# Patient Record
Sex: Female | Born: 1955
Health system: Southern US, Community
[De-identification: ages and names within clinical notes are randomized; demographics above are authoritative.]

## PROBLEM LIST (undated history)

## (undated) ENCOUNTER — Ambulatory Visit (HOSPITAL_COMMUNITY): Admission: EM | Payer: Medicare HMO | Source: Home / Self Care

## (undated) DIAGNOSIS — I10 Essential (primary) hypertension: Secondary | ICD-10-CM

## (undated) DIAGNOSIS — K219 Gastro-esophageal reflux disease without esophagitis: Secondary | ICD-10-CM

## (undated) DIAGNOSIS — E785 Hyperlipidemia, unspecified: Secondary | ICD-10-CM

## (undated) DIAGNOSIS — R32 Unspecified urinary incontinence: Secondary | ICD-10-CM

## (undated) DIAGNOSIS — M545 Low back pain, unspecified: Secondary | ICD-10-CM

## (undated) DIAGNOSIS — M7580 Other shoulder lesions, unspecified shoulder: Secondary | ICD-10-CM

## (undated) DIAGNOSIS — M1990 Unspecified osteoarthritis, unspecified site: Secondary | ICD-10-CM

## (undated) DIAGNOSIS — G8929 Other chronic pain: Secondary | ICD-10-CM

## (undated) HISTORY — DX: Low back pain: M54.5

## (undated) HISTORY — DX: Unspecified urinary incontinence: R32

## (undated) HISTORY — PX: HAMMER TOE SURGERY: SHX385

## (undated) HISTORY — PX: WISDOM TOOTH EXTRACTION: SHX21

## (undated) HISTORY — DX: Low back pain, unspecified: M54.50

## (undated) HISTORY — DX: Other shoulder lesions, unspecified shoulder: M75.80

## (undated) HISTORY — DX: Other chronic pain: G89.29

## (undated) HISTORY — DX: Unspecified osteoarthritis, unspecified site: M19.90

## (undated) HISTORY — PX: HERNIA REPAIR: SHX51

## (undated) HISTORY — PX: CARPAL TUNNEL RELEASE: SHX101

## (undated) HISTORY — PX: SHOULDER SURGERY: SHX246

---

## 1997-07-02 ENCOUNTER — Ambulatory Visit (HOSPITAL_COMMUNITY): Admission: RE | Admit: 1997-07-02 | Discharge: 1997-07-02 | Payer: Self-pay

## 1997-11-03 ENCOUNTER — Ambulatory Visit (HOSPITAL_COMMUNITY): Admission: RE | Admit: 1997-11-03 | Discharge: 1997-11-03 | Payer: Self-pay | Admitting: Obstetrics & Gynecology

## 1998-07-08 ENCOUNTER — Encounter: Payer: Self-pay | Admitting: Obstetrics & Gynecology

## 1998-07-08 ENCOUNTER — Ambulatory Visit (HOSPITAL_COMMUNITY): Admission: RE | Admit: 1998-07-08 | Discharge: 1998-07-08 | Payer: Self-pay | Admitting: Obstetrics & Gynecology

## 1999-07-27 ENCOUNTER — Encounter: Payer: Self-pay | Admitting: Obstetrics and Gynecology

## 1999-07-27 ENCOUNTER — Ambulatory Visit (HOSPITAL_COMMUNITY): Admission: RE | Admit: 1999-07-27 | Discharge: 1999-07-27 | Payer: Self-pay | Admitting: Obstetrics and Gynecology

## 1999-08-10 ENCOUNTER — Other Ambulatory Visit: Admission: RE | Admit: 1999-08-10 | Discharge: 1999-08-10 | Payer: Self-pay | Admitting: Obstetrics and Gynecology

## 1999-08-19 ENCOUNTER — Ambulatory Visit (HOSPITAL_COMMUNITY): Admission: RE | Admit: 1999-08-19 | Discharge: 1999-08-19 | Payer: Self-pay | Admitting: Obstetrics and Gynecology

## 1999-08-19 ENCOUNTER — Encounter: Payer: Self-pay | Admitting: Obstetrics and Gynecology

## 1999-09-07 ENCOUNTER — Encounter (INDEPENDENT_AMBULATORY_CARE_PROVIDER_SITE_OTHER): Payer: Self-pay

## 1999-09-07 ENCOUNTER — Other Ambulatory Visit: Admission: RE | Admit: 1999-09-07 | Discharge: 1999-09-07 | Payer: Self-pay | Admitting: Obstetrics and Gynecology

## 2000-01-31 ENCOUNTER — Other Ambulatory Visit: Admission: RE | Admit: 2000-01-31 | Discharge: 2000-01-31 | Payer: Self-pay | Admitting: Obstetrics and Gynecology

## 2000-03-07 ENCOUNTER — Encounter (INDEPENDENT_AMBULATORY_CARE_PROVIDER_SITE_OTHER): Payer: Self-pay | Admitting: *Deleted

## 2000-03-07 ENCOUNTER — Ambulatory Visit (HOSPITAL_COMMUNITY): Admission: RE | Admit: 2000-03-07 | Discharge: 2000-03-07 | Payer: Self-pay | Admitting: Gastroenterology

## 2000-05-30 ENCOUNTER — Other Ambulatory Visit: Admission: RE | Admit: 2000-05-30 | Discharge: 2000-05-30 | Payer: Self-pay | Admitting: Obstetrics and Gynecology

## 2000-05-30 ENCOUNTER — Encounter (INDEPENDENT_AMBULATORY_CARE_PROVIDER_SITE_OTHER): Payer: Self-pay

## 2000-08-02 ENCOUNTER — Ambulatory Visit (HOSPITAL_COMMUNITY): Admission: RE | Admit: 2000-08-02 | Discharge: 2000-08-02 | Payer: Self-pay | Admitting: Obstetrics and Gynecology

## 2000-08-02 ENCOUNTER — Encounter: Payer: Self-pay | Admitting: Obstetrics and Gynecology

## 2000-08-23 ENCOUNTER — Other Ambulatory Visit: Admission: RE | Admit: 2000-08-23 | Discharge: 2000-08-23 | Payer: Self-pay | Admitting: Obstetrics and Gynecology

## 2000-12-09 ENCOUNTER — Emergency Department (HOSPITAL_COMMUNITY): Admission: EM | Admit: 2000-12-09 | Discharge: 2000-12-09 | Payer: Self-pay

## 2001-03-20 HISTORY — PX: ABDOMINAL HYSTERECTOMY: SHX81

## 2001-05-08 ENCOUNTER — Ambulatory Visit (HOSPITAL_COMMUNITY): Admission: RE | Admit: 2001-05-08 | Discharge: 2001-05-08 | Payer: Self-pay | Admitting: Gastroenterology

## 2001-08-06 ENCOUNTER — Ambulatory Visit (HOSPITAL_COMMUNITY): Admission: RE | Admit: 2001-08-06 | Discharge: 2001-08-06 | Payer: Self-pay | Admitting: Obstetrics and Gynecology

## 2001-08-06 ENCOUNTER — Encounter: Payer: Self-pay | Admitting: Obstetrics and Gynecology

## 2001-09-05 ENCOUNTER — Other Ambulatory Visit: Admission: RE | Admit: 2001-09-05 | Discharge: 2001-09-05 | Payer: Self-pay | Admitting: Obstetrics and Gynecology

## 2001-09-12 ENCOUNTER — Encounter: Admission: RE | Admit: 2001-09-12 | Discharge: 2001-09-12 | Payer: Self-pay | Admitting: Urology

## 2001-09-12 ENCOUNTER — Encounter: Payer: Self-pay | Admitting: Urology

## 2001-10-08 ENCOUNTER — Encounter (INDEPENDENT_AMBULATORY_CARE_PROVIDER_SITE_OTHER): Payer: Self-pay | Admitting: Specialist

## 2001-10-08 ENCOUNTER — Inpatient Hospital Stay (HOSPITAL_COMMUNITY): Admission: RE | Admit: 2001-10-08 | Discharge: 2001-10-11 | Payer: Self-pay | Admitting: Obstetrics and Gynecology

## 2002-07-01 ENCOUNTER — Ambulatory Visit (HOSPITAL_BASED_OUTPATIENT_CLINIC_OR_DEPARTMENT_OTHER): Admission: RE | Admit: 2002-07-01 | Discharge: 2002-07-01 | Payer: Self-pay | Admitting: Urology

## 2002-07-01 ENCOUNTER — Encounter (INDEPENDENT_AMBULATORY_CARE_PROVIDER_SITE_OTHER): Payer: Self-pay | Admitting: Specialist

## 2002-08-13 ENCOUNTER — Ambulatory Visit (HOSPITAL_COMMUNITY): Admission: RE | Admit: 2002-08-13 | Discharge: 2002-08-13 | Payer: Self-pay | Admitting: Obstetrics and Gynecology

## 2002-08-13 ENCOUNTER — Encounter: Payer: Self-pay | Admitting: Obstetrics and Gynecology

## 2003-08-27 ENCOUNTER — Ambulatory Visit (HOSPITAL_COMMUNITY): Admission: RE | Admit: 2003-08-27 | Discharge: 2003-08-27 | Payer: Self-pay | Admitting: Obstetrics and Gynecology

## 2004-09-22 ENCOUNTER — Ambulatory Visit (HOSPITAL_COMMUNITY): Admission: RE | Admit: 2004-09-22 | Discharge: 2004-09-22 | Payer: Self-pay | Admitting: Orthopedic Surgery

## 2004-09-22 ENCOUNTER — Ambulatory Visit (HOSPITAL_BASED_OUTPATIENT_CLINIC_OR_DEPARTMENT_OTHER): Admission: RE | Admit: 2004-09-22 | Discharge: 2004-09-22 | Payer: Self-pay | Admitting: Orthopedic Surgery

## 2004-10-28 ENCOUNTER — Ambulatory Visit (HOSPITAL_COMMUNITY): Admission: RE | Admit: 2004-10-28 | Discharge: 2004-10-28 | Payer: Self-pay | Admitting: Obstetrics and Gynecology

## 2005-12-04 ENCOUNTER — Ambulatory Visit (HOSPITAL_COMMUNITY): Admission: RE | Admit: 2005-12-04 | Discharge: 2005-12-04 | Payer: Self-pay | Admitting: Obstetrics and Gynecology

## 2006-06-07 ENCOUNTER — Inpatient Hospital Stay (HOSPITAL_COMMUNITY): Admission: EM | Admit: 2006-06-07 | Discharge: 2006-06-09 | Payer: Self-pay | Admitting: Emergency Medicine

## 2006-06-07 ENCOUNTER — Ambulatory Visit: Payer: Self-pay | Admitting: Internal Medicine

## 2006-06-08 ENCOUNTER — Encounter: Payer: Self-pay | Admitting: Internal Medicine

## 2006-06-14 ENCOUNTER — Ambulatory Visit: Payer: Self-pay

## 2006-06-21 ENCOUNTER — Ambulatory Visit: Payer: Self-pay | Admitting: Cardiology

## 2006-07-27 ENCOUNTER — Encounter: Admission: RE | Admit: 2006-07-27 | Discharge: 2006-07-27 | Payer: Self-pay | Admitting: Family Medicine

## 2006-09-12 ENCOUNTER — Ambulatory Visit (HOSPITAL_BASED_OUTPATIENT_CLINIC_OR_DEPARTMENT_OTHER): Admission: RE | Admit: 2006-09-12 | Discharge: 2006-09-12 | Payer: Self-pay | Admitting: Orthopedic Surgery

## 2007-01-08 ENCOUNTER — Ambulatory Visit (HOSPITAL_COMMUNITY): Admission: RE | Admit: 2007-01-08 | Discharge: 2007-01-08 | Payer: Self-pay | Admitting: Obstetrics and Gynecology

## 2007-11-26 ENCOUNTER — Encounter: Payer: Self-pay | Admitting: Internal Medicine

## 2007-12-23 ENCOUNTER — Encounter: Payer: Self-pay | Admitting: Obstetrics and Gynecology

## 2007-12-23 ENCOUNTER — Inpatient Hospital Stay (HOSPITAL_COMMUNITY): Admission: AD | Admit: 2007-12-23 | Discharge: 2007-12-30 | Payer: Self-pay | Admitting: Obstetrics and Gynecology

## 2007-12-25 ENCOUNTER — Ambulatory Visit: Payer: Self-pay | Admitting: Infectious Diseases

## 2008-06-04 ENCOUNTER — Ambulatory Visit (HOSPITAL_COMMUNITY): Admission: RE | Admit: 2008-06-04 | Discharge: 2008-06-04 | Payer: Self-pay | Admitting: Obstetrics and Gynecology

## 2008-12-10 ENCOUNTER — Emergency Department (HOSPITAL_COMMUNITY): Admission: EM | Admit: 2008-12-10 | Discharge: 2008-12-10 | Payer: Self-pay | Admitting: Emergency Medicine

## 2009-01-23 ENCOUNTER — Emergency Department (HOSPITAL_COMMUNITY): Admission: EM | Admit: 2009-01-23 | Discharge: 2009-01-23 | Payer: Self-pay | Admitting: Emergency Medicine

## 2009-06-29 ENCOUNTER — Ambulatory Visit (HOSPITAL_COMMUNITY): Admission: RE | Admit: 2009-06-29 | Discharge: 2009-06-29 | Payer: Self-pay | Admitting: Obstetrics and Gynecology

## 2009-07-06 ENCOUNTER — Encounter: Admission: RE | Admit: 2009-07-06 | Discharge: 2009-07-06 | Payer: Self-pay | Admitting: Obstetrics and Gynecology

## 2010-02-08 ENCOUNTER — Ambulatory Visit (HOSPITAL_COMMUNITY)
Admission: RE | Admit: 2010-02-08 | Discharge: 2010-02-08 | Payer: Self-pay | Source: Home / Self Care | Admitting: Gastroenterology

## 2010-04-10 ENCOUNTER — Encounter: Payer: Self-pay | Admitting: Obstetrics and Gynecology

## 2010-04-10 ENCOUNTER — Encounter: Payer: Self-pay | Admitting: Gastroenterology

## 2010-06-08 ENCOUNTER — Ambulatory Visit (HOSPITAL_COMMUNITY)
Admission: RE | Admit: 2010-06-08 | Discharge: 2010-06-08 | Disposition: A | Discharge status: Home / Self Care | Payer: Self-pay | Source: Ambulatory Visit | Attending: Gastroenterology | Admitting: Gastroenterology

## 2010-06-08 DIAGNOSIS — K219 Gastro-esophageal reflux disease without esophagitis: Secondary | ICD-10-CM | POA: Insufficient documentation

## 2010-06-08 DIAGNOSIS — R1310 Dysphagia, unspecified: Secondary | ICD-10-CM | POA: Insufficient documentation

## 2010-06-08 DIAGNOSIS — K449 Diaphragmatic hernia without obstruction or gangrene: Secondary | ICD-10-CM | POA: Insufficient documentation

## 2010-06-08 DIAGNOSIS — D131 Benign neoplasm of stomach: Secondary | ICD-10-CM | POA: Insufficient documentation

## 2010-06-24 LAB — DIFFERENTIAL
Basophils Absolute: 0.1 10*3/uL (ref 0.0–0.1)
Basophils Relative: 1 % (ref 0–1)
Eosinophils Relative: 2 % (ref 0–5)
Lymphocytes Relative: 47 % — ABNORMAL HIGH (ref 12–46)
Monocytes Absolute: 0.3 10*3/uL (ref 0.1–1.0)
Neutro Abs: 1.9 10*3/uL (ref 1.7–7.7)

## 2010-06-24 LAB — BASIC METABOLIC PANEL
CO2: 28 mEq/L (ref 19–32)
GFR calc non Af Amer: >60 mL/min (ref >60)
Glucose, Bld: 93 mg/dL (ref 70–99)
Sodium: 140 mEq/L (ref 135–145)

## 2010-06-24 LAB — CBC
HCT: 37.1 % (ref 36.0–46.0)
Hemoglobin: 12.4 g/dL (ref 12.0–15.0)
MCHC: 33.5 g/dL (ref 30.0–36.0)
RBC: 4.25 MIL/uL (ref 3.87–5.11)
WBC: 4.5 10*3/uL (ref 4.0–10.5)

## 2010-06-24 LAB — URINALYSIS, ROUTINE W REFLEX MICROSCOPIC
Glucose, UA: NEGATIVE mg/dL
Hgb urine dipstick: NEGATIVE
Nitrite: NEGATIVE
Protein, ur: NEGATIVE mg/dL
pH: 7.5 (ref 5.0–8.0)

## 2010-06-24 LAB — WET PREP, GENITAL
Trich, Wet Prep: NONE SEEN
Yeast Wet Prep HPF POC: NONE SEEN

## 2010-06-24 LAB — URINE MICROSCOPIC-ADD ON

## 2010-06-24 LAB — GC/CHLAMYDIA PROBE AMP, GENITAL: GC Probe Amp, Genital: NEGATIVE

## 2010-07-23 ENCOUNTER — Emergency Department (HOSPITAL_COMMUNITY)
Admission: EM | Admit: 2010-07-23 | Discharge: 2010-07-23 | Disposition: A | Discharge status: Home / Self Care | Payer: Managed Care, Other (non HMO) | Attending: Emergency Medicine | Admitting: Emergency Medicine

## 2010-07-23 DIAGNOSIS — R209 Unspecified disturbances of skin sensation: Secondary | ICD-10-CM | POA: Insufficient documentation

## 2010-07-23 DIAGNOSIS — R002 Palpitations: Secondary | ICD-10-CM | POA: Insufficient documentation

## 2010-07-23 DIAGNOSIS — I10 Essential (primary) hypertension: Secondary | ICD-10-CM | POA: Insufficient documentation

## 2010-07-23 DIAGNOSIS — Z79899 Other long term (current) drug therapy: Secondary | ICD-10-CM | POA: Insufficient documentation

## 2010-07-23 DIAGNOSIS — E7800 Pure hypercholesterolemia, unspecified: Secondary | ICD-10-CM | POA: Insufficient documentation

## 2010-07-23 DIAGNOSIS — K219 Gastro-esophageal reflux disease without esophagitis: Secondary | ICD-10-CM | POA: Insufficient documentation

## 2010-07-23 LAB — POCT I-STAT, CHEM 8
Chloride: 106 mEq/L (ref 96–112)
Creatinine, Ser: 0.8 mg/dL (ref 0.4–1.2)
Glucose, Bld: 93 mg/dL (ref 70–99)
Potassium: 3.3 mEq/L — ABNORMAL LOW (ref 3.5–5.1)

## 2010-08-01 ENCOUNTER — Other Ambulatory Visit (HOSPITAL_COMMUNITY): Payer: Self-pay | Admitting: Obstetrics and Gynecology

## 2010-08-01 DIAGNOSIS — Z1231 Encounter for screening mammogram for malignant neoplasm of breast: Secondary | ICD-10-CM

## 2010-08-02 NOTE — H&P (Signed)
NAME:  [name], ROTHER NO.:  [id number]   MEDICAL RECORD NO.:  [id number]          PATIENT TYPE:  OBV   LOCATION:  9319                          FACILITY:  [facility]   PHYSICIAN:  Huel Cote, M.D. DATE OF BIRTH:  [birth year]   DATE OF ADMISSION:  12/23/2007  DATE OF DISCHARGE:                              HISTORY & PHYSICAL   The patient is a 55 year old G2, P2 who came in with onset of a swelling  and painful area in her vulva and right buttock which has become  significantly enlarged and indurated over the last 3 days.  She is  evaluated in the office and found to have a significant cellulitis with  the entire right buttock indurated and hard with some question of  fluctuance.  There was an attempted drainage performed with no fluid  obtained, and the patient subsequently had a CT scan with IV contrast  which confirmed that there were no abscess pockets, just significant  cellulitis extending throughout the soft tissue and fatty tissue of the  entire right buttock.  The patient feels that this began laterally,  progressed medially and worsened to the point that it is at now.  She  has had some cold chills, is unsure if she has been running a  temperature, no other associated symptoms.  She denies any trauma to the  area.  She does state that she began to use some Estrace cream at the  vaginal introitus last week and was treated for a urinary tract  infection with Cipro last week.   PAST MEDICAL HISTORY:  Significant for chronic hypertension,  hypercholesterolemia and interstitial cystitis.   PAST SURGICAL HISTORY:  She had a bunionectomy, tubal ligation and an  abdominal hysterectomy/bilateral salpingo-oophorectomy in 2003.   PAST OBSTETRICAL HISTORY:  Two vaginal deliveries.   PAST GYN HISTORY:  History of low-grade SIL which has resolved.   CURRENT MEDICATIONS:  Include Norvasc, Zetia and Zegerid as well as  calcium and fish oil.   ALLERGIES:  She is  allergic to SULFA.   PHYSICAL EXAMINATION:  VITAL SIGNS:  Her blood pressure is 120/70.  CARDIAC:  Regular rate and rhythm.  LUNGS:  Clear.  ABDOMEN:  Soft and nontender.  PELVIC:  On her genitalia, the vaginal vault itself is clear.  However,  induration begins just past the right lateral vaginal sidewall and  extends down through the buttock area.  There is no significant swelling  over the Bartholin's.  However, the induration does extend up into  conjunction with that.  Small incision was made at the inner area and on  the buttock with no significant fluid obtained.  A culture was done of  these areas that will likely be inconclusive.  As stated, the patient  has a significant cellulitis involving the subcutaneous fat of the right  buttock in the ischiorectal fossa.  There is no evidence of abscess.   Her case was discussed with Dr. Darlina Sicilian who agrees with hospital  admission and IV antibiotics.  He recommends treatment with vancomycin  to cover any possible MRSA, and the patient will  also be placed on  Unasyn to cover Strep and Staph.  She will remain inpatient until  significant clearing of the induration, and infectious disease is aware  of the patient should any inpatient consult be necessary.      Huel Cote, M.D.  Electronically Signed     KR/MEDQ  D:  12/23/2007  T:  12/23/2007  Job:  [id number]

## 2010-08-02 NOTE — Op Note (Signed)
NAMESARETTA, DAHLEM              ACCOUNT NO.:  [id number]   MEDICAL RECORD NO.:  [id number]          PATIENT TYPE:  AMB   LOCATION:  DSC                          FACILITY:  [facility]   PHYSICIAN:  Mila Homer. Sherlean Foot, M.D. DATE OF BIRTH:  27-Jul-1955   DATE OF PROCEDURE:  09/12/2006  DATE OF DISCHARGE:                               OPERATIVE REPORT   SURGEON:  Mila Homer. Sherlean Foot, M.D.   ASSISTANT:  None.   ANESTHESIA:  General.   PREOPERATIVE DIAGNOSIS:  Right shoulder impingement syndrome and  acromioclavicular joint arthritis.   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   PROCEDURE:  Right shoulder arthroscopy, subacromial decompression,  distal clavicle resection.   INDICATIONS FOR PROCEDURE:  The patient is a 55 year old black female  with failure of conservative measures for shoulder impingement syndrome.  Informed consent was obtained.   DESCRIPTION OF PROCEDURE:  The patient was laid supine, administered  general anesthesia, and placed in the beach chair position, and the  right shoulder prepped and draped in the usual sterile fashion.  Anterior and direct lateral portals were created with a #11 blade, blunt  trocar and cannula.  Diagnostic arthroscopy of the glenohumeral joint  revealed no pathology whatsoever.  I then went to the subacromial space  and the posterior portal and through the direct lateral portal performed  a bursectomy.  The shoulder was very tight. There was approximately 3-4  mm only of space between the rotator cuff and the acromion and AC joint.  A 4 mm cylindrical bur was used to perform a decompression of the  anterolateral acromion all the way to and into the [facility] joint.  I then  performed a distal clavicle resection, as well.  This afforded excellent  exposure.  The CA ligament was released with ArthroCare debridement  wand, as well.  Further debridement of the rotator cuff revealed no  partial or full thickness tears.  I then evacuated the subacromial space  and closed with 4-0 nylon sutures, dressed with Xeroform, dressing  sponges, ABDs, 2 inch silk tape, and a simple sling.   COMPLICATIONS:  None.   DRAINS:  None.           ______________________________  Mila Homer. Sherlean Foot, M.D.     SDL/MEDQ  D:  09/12/2006  T:  09/12/2006  Job:  [id number]

## 2010-08-05 NOTE — H&P (Signed)
NAMEKIMBERELY, MCCANNON NO.:  [id number]   MEDICAL RECORD NO.:  [id number]          PATIENT TYPE:  INP   LOCATION:  1828                         FACILITY:  [facility]   PHYSICIAN:  Hettie Holstein, D.O.    DATE OF BIRTH:  [birth year]   DATE OF ADMISSION:  06/07/2006  DATE OF DISCHARGE:                              HISTORY & PHYSICAL   PRIMARY CARE PHYSICIAN:  Dr. Artis Flock.   CHIEF COMPLAINT:  Chest pain and dizziness.   HISTORY OF PRESENT ILLNESS:  Mrs. [name] is a pleasant 55 year old  African American female who was noted to be in her usual state of health  up until Sunday when she began to develop reproducible right-sided chest  pressure, 5/10 on a scale, nonradiating and not associated with  diaphoresis or shortness of breath but associated with nausea.  No  vomiting.  She says these would last for about 30 minutes in duration.  She has no prior known cardiac history.  She has sustained a  musculoskeletal injury at work and is attending therapy through [employer]  She stated that this began on Thursday and Friday when she  started using weights.  She states that she developed these symptoms and  they are exacerbated by bending over to pick up objects on the ground.  If she raises up slowly, these are less severe.  In any event, in the  emergency department, she is evaluated.  Point of care markers are  negative.  Her EKG reveals some nonspecific findings.  QTC is 495.  She  does have some lateral T-wave flattening and some PVCs.  She is being  admitted for further workup.   PAST MEDICAL HISTORY:  She is status post total abdominal hysterectomy  and bilateral salpingo-oophorectomy.  She has a history of hypertension.   MEDICATIONS:  She uses [facility] on [address].  She cannot recall  the dosages of her medication.  She feels that she is on Norvasc 10 mg  daily and hydrochlorothiazide 12.5 daily.  She is on a muscle relaxer as  well as anti-inflammatory for  which she cannot provide the details.   ALLERGIES:  She reports allergy to SULFA.   SOCIAL HISTORY:  She has two daughters, one of them lives with her.  She  is a widow.  She denies tobacco or alcohol.  She continues to work in  [employer].  She has recently had to go to worker's comp for work-  related shoulder injury.  She formerly exercised and walked on a  treadmill for about 30 minutes duration.  She does not have a dyspnea on  exertion.   FAMILY HISTORY:  Mother died age 4.  She suffered from all Alzheimer's  dementia.  Father died in his 30s.  He had no early disease.  He did  have colon cancer diagnosed at age 30.   REVIEW OF SYSTEMS:  She has had some intentional weight loss with the  changes of her diet.  She has had some nausea as described above.  No  diarrhea.  No swelling of the extremities.  Otherwise further review of  systems is unremarkable.   PHYSICAL EXAMINATION:  In the emergency department, her vital signs were  stable with blood pressure 125/87.  O2 saturation 100%, heart rate 77,  afebrile at night.  GENERAL:  The patient is alert and no acute distress sitting up in bed.  She appears to be comfortable with nontoxic appearance.  She does suffer  from halitosis, however.  The neck is supple, nontender.  No palpable  thyromegaly or mass.  CARDIOVASCULAR:  Exam reveals normal S1-S2 without appreciable murmurs.  LUNGS:  Clear.  She exhibits normal effort.  There is no dullness to  percussion.  ABDOMEN:  Soft and nontender.  No rebound or guarding.  LOWER EXTREMITIES:  Reveal no edema, no calf tenderness.  NEUROLOGIC:  Exam revealed no focal deficits.   LABORATORY DATA:  Her creatinine was 0.9 on I-stat.  Her chest x-ray  revealed cardiomegaly without edema.  Her urinalysis was unremarkable  with exception of 3-6 RBCs.  Point of care markers were negative.  CBC  revealed normal white count, hemoglobin 12, platelet count 254, MCV of  87.   ASSESSMENT:   1. Chest pain, rule out myocardial infarction.  2. Cardiomegaly on chest x-ray:  Obtain a 2-D echocardiogram.  3. Hypertension.  4. Dizziness perhaps vertigo:  We will check orthostatics as well.   PLAN:  As noted above.  We will cycle markers, follow clinical course  and obtain 2-D echocardiogram.      Hettie Holstein, D.O.  Electronically Signed     ESS/MEDQ  D:  06/07/2006  T:  06/07/2006  Job:  [id number]   cc:   Quita Skye. Artis Flock, M.D.

## 2010-08-05 NOTE — Op Note (Signed)
NAME:  [name], [name]                        ACCOUNT NO.:  [id number]   MEDICAL RECORD NO.:  [id number]                   PATIENT TYPE:  AMB   LOCATION:  NESC                                 [facility]ACILITY:  [facility]   PHYSICIAN:  Ronald L. Ovidio Hanger, M.D.           DATE OF BIRTH:  09/04/1955   DATE OF PROCEDURE:  07/01/2002  DATE OF DISCHARGE:                                 OPERATIVE REPORT   DIAGNOSIS:  Bladder pain, urgency, frequency.  Rule out interstitial  cystitis.   PROCEDURES:  1. Cystourethroscopy.  2. Hydraulic bladder distention.  3. Bladder biopsy.   SURGEON:  Lucrezia Starch. Earlene Plater, M.D.   ANESTHESIA:  General laryngeal airway.   ESTIMATED BLOOD LOSS:  Negligible.   TUBES:  None.   COMPLICATIONS:  None.   FINDINGS:  A capacity of 500 mL was noted at 80 cmH2O with submucosal  glomerulations consistent with IC.   INDICATION FOR PROCEDURE:  The patient is a lovely 55 year old black female  who has had long-term symptoms of urgency, frequency, suprapubic pain,  labile voiding, and hesitancy.  She has had known urethritis requiring  urethral dilatation and most recently had a hysterectomy.  She had very  large fibroids, which were impinging on the urethra, and we felt this might  be contributory.  Although she has had her hysterectomy, she is doing better  from that standpoint, but she still continued to have symptoms and we felt  that interstitial cystitis was a significant possibility.  A CT scan had  revealed fibroids, but no other masses were noted in the pelvis or other  potentiating factors.  After understanding risks, benefits, and  alternatives, she has elected to proceed with the above procedure.   DESCRIPTION OF PROCEDURE:  The patient was placed in the supine position,  after proper general laryngeal airway anesthesia was placed in the dorsal  lithotomy position and prepped and draped with Betadine in sterile fashion.  Cystoscopy performed with the 22.5 French  Olympus panendoscope utilizing the  12 and 70 degree lenses.  The bladder was carefully inspected and noted to  be without lesions.  Efflux of clear urine was noted through the normally-  placed ureteral orifices bilaterally.  A hydraulic bladder distention was  then performed to 80 cmH2O until at that point the bladder was drained and  noted to have a total of 500 mL capacity and on reinspection, there were  significant submucosal glomerulations of the dome, posterior wall, and some  laterally also, consistent with interstitial cystitis, and there was some  hemorrhage noted.  Images were taken with the camera for documentation.  A  biopsy was then obtained of the posterior midline near glomerulations with a  cold cup biopsy forceps and submitted to pathology.  The base was cauterized  with Bugbee coagulation cautery.  There was some circumferential  urethritis noted, which we had seen previously, but no other lesions were  noted.  The bladder was drained, the panendoscope was removed, the patient  was taken to the recovery room stable.   PLAN:  Most likely a course of DMSO-based therapy.                                                Ronald L. Ovidio Hanger, M.D.    RLD/MEDQ  D:  07/01/2002  T:  07/01/2002  Job:  [id number]

## 2010-08-05 NOTE — Procedures (Signed)
[facility]  Patient:    [name], [name] Visit Number: [id number] MRN: [id number]          Service Type: Attending:  Verlin Grills, M.D. Dictated by:   Verlin Grills, M.D. Proc. Date: 05/08/01                             Procedure Report  PROCEDURE:  Esophagogastroduodenoscopy.  PROCEDURE INDICATION:  Ms. Surya Folden is a 55 year old female born 1955/06/26.  Ms. Mcneff has gastroesophageal reflux type symptoms.  On May 08, 1997, a barium swallow upper GI x-ray series was normal. Ms. Klontz was unable to take Nexium due to headaches.  She was seen in my office April 26, 2001, complaining of retrosternal burning discomfort while on Tagamet.  I placed her on Aciphex 20 mg each morning and Tagamet at bedtime and scheduled her for an esophagogastroduodenoscopy.  She reports good control of her gastroesophageal reflux on a combination of Aciphex and Tagamet.  ENDOSCOPIST:  Verlin Grills, M.D.  PREMEDICATION:  Versed 5 mg, Demerol 50 mg.  ENDOSCOPE:  Olympus gastroscope.  DESCRIPTION OF PROCEDURE:  After obtaining informed consent, Ms. Trussell was placed in the left lateral decubitus position.  I administered intravenous Versed and intravenous Demerol to achieve conscious sedation for the procedure.  The patients blood pressure, oxygen saturation, and cardiac rhythm were monitored throughout the procedure and documented in the medical record.  The Olympus gastroscope was passed through the posterior hypopharynx into the proximal esophagus without difficulty.  The hypopharynx, larynx, and vocal cords appeared normal.  Esophagoscopy:  The proximal, mid, and lower segments of the esophagus appeared normal.  The squamocolumnar junction and esophagogastric junction are noted at approximately 45 cm from the incisor teeth.  Endoscopically, there is no evidence for the presence of erosive esophagitis, Barretts  esophagus, esophageal mucosal scarring, or esophageal ulceration.  Gastroscopy:  Retroflexed view of the gastric cardia and fundus was normal. The gastric body, antrum, and pylorus appeared normal.  Duodenoscopy:  The duodenal bulb, mid duodenum, and distal duodenum appeared normal.  ASSESSMENT:  Gastroesophageal reflux associated with a normal esophagogastroduodenoscopy.  RECOMMENDATIONS:  Continue Aciphex 20 mg each morning and bedtime Tagamet to control gastroesophageal reflux symptoms. Dictated by:   Verlin Grills, M.D. Attending:  Verlin Grills, M.D. DD:  05/08/01 TD:  05/08/01 Job: [id number]

## 2010-08-05 NOTE — Discharge Summary (Signed)
   NAME:  [name], [name]                        ACCOUNT NO.:  [id number]   MEDICAL RECORD NO.:  [id number]                   PATIENT TYPE:  INP   LOCATION:  9309                                 FACILITY:  [facility]   PHYSICIAN:  Oliver Pila, M.D.           DATE OF BIRTH:  [birth year]   DATE OF ADMISSION:  10/08/2001  DATE OF DISCHARGE:  10/10/2001                                 DISCHARGE SUMMARY   DISCHARGE DIAGNOSES:  1. Menorrhagia and dysmenorrhea.  2. Fibroid uterus.  3. Urinary symptoms from fibroid compression.  4. Status post total abdominal hysterectomy, bilateral salpingo-     oophorectomy.   DISCHARGE MEDICATIONS:  1. Motrin 600 mg p.o. q.6h.  2. Percocet 1-2 tablets p.o. q.4h. p.r.n.  3. Norvasc 10 mg p.o. daily.  4. ___________10 mg p.o. daily.   DISCHARGE FOLLOW UP:  The patient is to follow up in our office on Monday,  July 28 for staple removal at 9 a.m. and again she will follow up in six  weeks for a full postoperative evaluation.   HOSPITAL COURSE:  The patient is a 55 year old, G2, P2, who was admitted for  a scheduled hysterectomy given an ongoing problem with multiple complaints  related to fibroid uterus. She underwent a total abdominal hysterectomy,  bilateral salpingo-oophorectomy on October 08, 2001, without difficulty and was  admitted for routine postoperative care. Postoperatively, the patient did  very well. She remained afebrile and her blood pressure as well-controlled.  Her postoperative hemoglobin was essentially unchanged at 10.3 from 10.4  preoperatively. On postoperative day #2 she was tolerating a regular diet  with no nausea or vomiting, voiding without difficulty, passing small  amounts of flatus, and her pain was well-controlled. Again, she was afebrile  and her vital signs were stable. Her abdomen was soft and her incision was  clear with no erythema noted, and therefore the patient was felt stable for  discharge home and was discharged  to follow up in approximately four days  for staple removal.                                                 Oliver Pila, M.D.    KWR/MEDQ  D:  10/10/2001  T:  10/19/2001  Job:  [id number]

## 2010-08-05 NOTE — H&P (Signed)
[facility]  Patient:    [name], [name] Visit Number: [id number] MRN: [id number]          Service Type: Attending:  Alvino Chapel, M.D. Dictated by:   Alvino Chapel, M.D. Adm. Date:  10/08/01                           History and Physical  HISTORY OF PRESENT ILLNESS:   The patient is a 55 year old, G2, P2, who presents for a scheduled hysterectomy given an ongoing problem with a fibroid uterus which has grown in size and is causing dysmenorrhea and menorrhagia, as well as bladder symptoms with increasing frequency.  The patient had an extensive work-up by her urologist recently and on that exam cystoscopy revealed significant bladder compression by her uterine fibroid, which they felt was more significant for her frequency than any other problem.  PAST MEDICAL HISTORY:         Medically the patients past medical history is significant for chronic hypertension and hypercholesterolemia.  PAST SURGICAL HISTORY:        Bunion and BTL surgery.  PAST OBSTETRICAL HISTORY:     She has had two normal spontaneous vaginal deliveries.  PAST GYNECOLOGICAL HISTORY:   The patient has had a history of low-grade SIL Pap smears, however, these had resolved on the most recent Pap done.  FAMILY HISTORY:               There is no breast cancer.  There is colon cancer in her father.  No significant history of heart attacks.  MEDICATIONS:                  Norvasc and Zeta.  ALLERGIES:                    SULFA.  PHYSICAL EXAMINATION:         The patient is 152 pounds.  VITAL SIGNS:                  The blood pressure is 140/90.  BREASTS:                      No masses, discharge, or adenopathy noted.  CARDIAC:                      Regular rate and rhythm.  LUNGS:                        Clear.  ABDOMEN:                      Soft and nontender.  PELVIC:                       There are normal external genitalia noted.  The uterus is upper  limits of normal, the right side measuring greater than the left, consistent with fibroid uterus.  The adnexa have no masses.  The entire uterus measures approximately 14 weeks in size.  RECTAL:                       Heme negative.  The patient was counseled as to the risks and benefits of surgery, including bleeding, infection, and possible damage to bowel and bladder.  She was given the option of removal of her tubes and ovaries,  however, if those appear normal, she elects to retain those at the time of surgery.  Different approaches were discussed with the patient, however, given the size of the uterus it was felt that she would most likely need and abdominal approach and that the size of the fibroids would prohibit vaginal removal.  The patient understands the risks of bleeding, infection, and possible damage to bowel and bladder well and agrees to proceed with the surgery as stated. Dictated by:   Alvino Chapel, M.D. Attending:  Alvino Chapel, M.D. DD:  10/07/01 TD:  10/07/01 Job: [id number]

## 2010-08-05 NOTE — Discharge Summary (Signed)
[name], [name]              ACCOUNT NO.:  [id number]   MEDICAL RECORD NO.:  [id number]          PATIENT TYPE:  INP   LOCATION:  3732                         FACILITY:  [facility]   PHYSICIAN:  Beckey Rutter, MD  DATE OF BIRTH:  [birth year]   DATE OF ADMISSION:  06/07/2006  DATE OF DISCHARGE:  06/09/2006                               DISCHARGE SUMMARY   PRIMARY CARE PHYSICIAN:  Dr. Artis Flock.   CHIEF COMPLAINT UPON ADMISSION:  Chest pain and dizziness.   HISTORY OF PRESENT ILLNESS:  This is 55 year old, African-American  female came with chest pain and dizziness, admitted to rule out acute  myocardial infarction/acute coronary syndrome, found to have prolonged  QTC and T-wave flattening with some PVCs on the EKG.  For full H&P  please refer to the H&P dictated on June 07, 2006 by Dr. Hannah Beat.   HOSPITAL COURSE:  Chest pain.  The patient was kept on telemetry for  monitoring which is essentially negative during the hospital course.  Patient also was ruled out for acute myocardial infarction injury by  serial cardiac enzymes.  Patient was seen during hospitalization by  cardiologist Dr. Dietrich Pates with recommendation of a stress test as an  outpatient.  Overall patient was doing better regarding the epigastric  and the chest pain/pressure.  Patient was taking Protonix which could be  the reason for her symptom relief as well, hence, seems like all the  symptoms is not cardiogenic.   HOSPITAL CONSULTATION:  Patient was seen by cardiologist Dr. Dietrich Pates  and she will follow up with her as an outpatient for further assessment  and management.   HOSPITAL PROCEDURES:  A 2D echo was essentially normal.  There is no  wall motion abnormalities.  Ejection fraction 60%.   DISCHARGE MEDICATIONS:  1. Norvasc 5 mg p.o. daily.  2. Protonix 40 mg p.o. daily.  3. Zetia 10 mg p.o. h.s.   DISCHARGE DIAGNOSES:  1. Chest pain/chest pressure which seems to be noncardiogenic in  origin.  2. Hypertension.  3. Gastroesophageal reflux disease.  4. Shoulder injury.  5. History of uterine fibroid surgically treated in July 2003.  6. History of carpal tunnel on the right.   DISCHARGE RECOMMENDATION:  Patient is feeling better now she is stable  for discharge.  Will discharge the patient on Protonix and we will  decrease the dose for hypertension since she seems to have dizziness  which could be attributed to the doses of Norvasc as well as other  medication including the muscle relaxant so she was  recommended to take half dose of Norvasc which is equivalent to 5 mg  p.o. daily and follow up with her primary physician as discussed with  her.  Patient also will follow up with the cardiologist for further  assessment and management of her chest pain and consider the possibility  of a stress test and as outpatient.      Beckey Rutter, MD  Electronically Signed     EME/MEDQ  D:  06/09/2006  T:  06/09/2006  Job:  [id number]

## 2010-08-05 NOTE — Op Note (Signed)
[facility]  Patient:    [name], PLANCARTE Visit Number: [id number] MRN: [id number]          Service Type: GYN Location: [id number] Attending Physician:  Oliver Pila Dictated by:   Alvino Chapel, M.D. Proc. Date: 10/08/01 Admit Date:  10/08/2001 Discharge Date: 10/11/2001                             Operative Report  PREOPERATIVE DIAGNOSES:       1. Menorrhagia.                               2. Dysmenorrhea.                               3. Fibroid uterus.                               4. Urinary frequency.  POSTOPERATIVE DIAGNOSES:      1. Menorrhagia.                               2. Dysmenorrhea.                               3. Fibroid uterus.                               4. Urinary frequency.  PROCEDURE:                    Total abdominal hysterectomy, bilateral salpingo-oophorectomy.  SURGEONS:                      Alvino Chapel, M.D.                                Zenaida Niece, M.D.  ANESTHESIA:                   General.  ESTIMATED BLOOD LOSS:         150 cc.  URINE OUTPUT:                 600 cc.  INTRAVENOUS FLUIDS:           2200 cc LR.  FINDINGS:                     The uterus is normal in size with a large 6 cm fibroid located off the right posterior fundus and dislocating the uterus anteriorly and to the left.  The tubes and ovaries were normal bilaterally.  PROCEDURE:                    The patient was taken to the operating room where general anesthesia was obtained without difficulty.  She was then prepped and draped in the normal sterile fashion in the dorsal supine position.  A Pfannenstiel skin incision was made through a preexisting scar and carried through to the underlying layer of fascia by sharp dissection and Bovie cautery.  The fascia was then nicked in the midline and the incision extended laterally with Mayo scissors.  Inferiorly the incision was grasped with Kocher clamps,  elevated, and dissected off the underlying rectus muscles. In a similar fashion the superior aspect was dissected off the underlying rectus muscles.  The rectus muscles were separated in the midline, the peritoneal cavity entered bluntly, and the peritoneal incision extended both superiorly and inferiorly with careful attention to avoid both bowel and bladder.  The pediatric Terressa Koyanagi was placed within the incision and the bowel packed away with moist laparotomy sponges.  Two curved Kelly clamps were then placed in each uterine cornua and the uterus was elevated through the incision.  The round ligament was taken down bilaterally with Bovie cautery and the retroperitoneal space opened as well with Bovie cautery.  A bladder flap was created to the midline on each side and exposed the cervix and lower uterine segment.  Each infundibulopelvic ligament was then isolated and clamped with a slightly curved Zeppelin clamp and transected and suture ligated x2 with 0 Vicryl.  The uterine artery on the left was then skeletonized carefully, clamped, transected, and suture ligated with 0 Vicryl.  Attention was turned to the patients right where though the uterine artery was not clearly visible, the posterior fibroid was grasped with a towel clip and elevated with the overlying broad ligament leaves dissected away.  This enabled a plane for clamping just at the base of the fibroid which was performed with slightly curved Zeppelin clamps and transected and suture ligated at each step with 0 Vicryl at the level of the cervix.  A straight Zeppelin clamp was utilized and clamped the uterosacral ligament. This was transected, suture ligated with 0 Vicryl, and held with a hemostat. Uterosacral ligament on the patients left in a similar fashion was grasped with a Zeppelin clamp, transected, suture ligated, and held in a hemostat. Thus, the cuff was isolated and the two slightly curved Zeppelins placed at each  vaginal angle.  This was then opened and the vagina entered and trimmed across completely amputating the cervix and uterus which was handed off as a specimen.  The vaginal cuff was then closed with 0 Vicryl at each angle at the curved Zeppelin clamps and along the medial portion with interrupted figure-of-eight sutures of 0 Vicryl.  The abdomen and pelvis were then irrigated x2 with only two small areas of bleeding noted which were controlled with Bovie cautery.  All pedicles were once again inspected and found to be hemostatic.  The ureters were closely inspected and found to be normal in caliber and motility bilaterally well away from the surgical site.  Therefore, the uterosacral ligaments were then approximated with one suture of 0 Vicryl for added pelvic support and trimmed with suture scissors the vaginal cuff and uterosacral sutures.  All instruments and sponges were then removed from the abdomen.  The rectus muscles and mesentery placed in their anatomical position.  The subfascial planes were inspected and found to be hemostatic. Therefore the fascia was closed with 0 Vicryl in a running fashion and the skin was closed with staples.  At the conclusion of the procedure sponge, lap, and needle counts were correct x2 and the patient was taken to the recovery room awake and in stable condition. Dictated by:   Alvino Chapel, M.D. Attending Physician:  Oliver Pila DD:  10/08/01 TD:  10/12/01 Job: [id number]

## 2010-08-05 NOTE — Discharge Summary (Signed)
[name], [name]              ACCOUNT NO.:  [id number]   MEDICAL RECORD NO.:  [id number]          PATIENT TYPE:  INP   LOCATION:  9319                          FACILITY:  [facility]   PHYSICIAN:  Huel Cote, M.D. DATE OF BIRTH:  1956-01-22   DATE OF ADMISSION:  12/23/2007  DATE OF DISCHARGE:  12/30/2007                               DISCHARGE SUMMARY   DISCHARGE DIAGNOSES:  1. Severe cellulitis of the right buttock and pelvis.  2. Methicillin-resistant Staphylococcus aureus cellulitis, confirmed.   DISCHARGE MEDICATIONS:  1. Doxycycline 100 mg p.o. b.i.d.  2. Motrin 600 mg p.o. every 6 hours p.r.n.   DISCHARGE FOLLOWUP:  The patient is to follow up in the office in  approximately 1 week to re-inspect her cellulitis and ensure it is  continuing to improve.   HOSPITAL COURSE:  The patient is a 55 year old G2, P2, who came into the  office complaining of swelling of her right vulva and buttock.  She was  examined and found to have pretty severe cellulitis with the entire  right buttock indurated and hard and some question of fluctuance.  There  was an attempted IND performed, and there was absolutely no fluid  obtainable.  Subsequently, she had a CT scan with IV contrast, which  confirmed there were no abscess pockets, just significant cellulitis  extending throughout the soft tissue and fatty tissue of the entire  right buttock.  The patient declined flagrant temperatures; however, did  state she had had some cold chills.  Cultures were sent of the area, and  she was admitted to the hospital for IV therapy given the severity of  the infection.  After conversation with Infectious Disease, she was  placed on vancomycin and Unasyn to cover possible methicillin-resistant  Staph aureus.  Her initial white blood cell count was 11.9; however, she  did have greater than 20% band cells noted.  She was then admitted and  remained in-house on IV therapy for a total of 7 days.  By the  seventh  day, she was improving and the induration was still present, but was  markedly decreased from her admission.  There was also some skin  weepage, which had slowed down considerably.  Her last IV antibiotics  were on the day of discharge and she was converted to p.o. doxycycline.  During the course of the hospitalization, she had had some mildly  elevated LFTs and had HIV and hepatitis C panels performed, which were  normal.  Her LFTs subsequently normalized after she went off one of her  IV antibiotics.  The patient did well and will follow up in the office  in 1 week for a wound check.      Huel Cote, M.D.  Electronically Signed     KR/MEDQ  D:  01/26/2008  T:  01/26/2008  Job:  [id number]

## 2010-08-05 NOTE — Assessment & Plan Note (Signed)
[facility]                            CARDIOLOGY OFFICE NOTE   [name], [name]                     MRN:          [id number]  DATE:06/21/2006                            DOB:          Oct 18, 1955    PRIMARY CARE PHYSICIAN:  Dr. Bradd Canary.   CARDIOLOGIST:  The patient is new to Dr. Dietrich Pates.   HISTORY OF PRESENT ILLNESS:  [name] is a 55 year old female patient  who was admitted to [facility] on March 20th with chest pain.  She ruled out for myocardial infarction by enzymes.  She was seen by Dr.  Tenny Craw in consultation.  It was recommended that she follow up for an  outpatient stress test.  She did have an echocardiogram that showed an  EF of 60%.  Her stress test was done on March 27th.  She exercised for 9  minutes and 1 second.  She had some PVCs noted without sustained  arrhythmia.  There was no chest pain.  There was no sign of scar or  ischemia on her nuclear images, and an EF of 57%.  The patient's  cholesterol was noted to be abnormal in the hospital with an LDL in the  180 range.  It was recommended that she get back on cholesterol  medication.  She had been on Zetia in the past with intolerance to  STATINS.  She was placed back on this.  She also complained of some  lightheadedness.  Apparently, her diuretic was discontinued and her  Norvasc dose was decreased.  She returns today for followup.  She  continues to notice some occasional, atypical discomfort with bending  over.  She denies any exertional or chest discomfort, shortness of  breath, or syncope.   CURRENT MEDICATIONS:  1. Norvasc 5 mg daily.  2. Protonix 40 mg a day.  3. Zetia 10 mg a day.   ALLERGIES:  SULFA.   PHYSICAL EXAMINATION:  She is a well-nourished, well-developed female in  no acute distress.  Blood pressure 136/79, pulse 72, weight 149 pounds.  HEENT:  Unremarkable.  CARDIOVASCULAR:  Normal S1, S2.  Regular rate and rhythm.  LUNGS:  Clear  to auscultation bilaterally.  ABDOMEN:  Soft, nontender.  EXTREMITIES:  Without edema.   IMPRESSION:  1. Noncardiac chest pain.      a.     Nonischemic stress Myoview, June 14, 2006.      b.     Good left ventricular function.  2. Hypertension.  3. Hyperlipidemia.  Intolerance to STATINS.  4. Gastroesophageal reflux disease.  5. History of shoulder injury.  6. Elevated hemoglobin A1c at 6.1 in the hospital with no history of      diabetes mellitus.   PLAN:  The patient presents to the office today for a post  hospitalization followup.  She had a negative Myoview scan.  She will  need to proceed with continued risk factor modification.  I have  recommended that she follow up with her primary care physician for this.  She was placed on proton pump inhibitor in the hospital, as it was  thought that some of her symptoms may be related gastroesophageal reflux  disease.  She should continue on Protonix and follow with Dr. Artis Flock for  this further.  She can follow up with Dr. Tenny Craw in the future on a p.r.n.  basis.  I have apprised her of hemoglobin A1c results, and she should  follow up with Dr. Artis Flock for this as well.      Tereso Newcomer, PA-C  Electronically Signed      Arturo Morton. Riley Kill, MD, [facility]  Electronically Signed   SW/MedQ  DD: 06/21/2006  DT: 06/21/2006  Job #: [id number]   cc:   Quita Skye. Artis Flock, M.D.

## 2010-08-05 NOTE — Procedures (Signed)
. [facility]  Patient:    [name], [name]                       MRN: [id number] Proc. Date: 03/07/00 Adm. Date:  [id number] Attending:  Charna Elizabeth CC:         Meliton Rattan, M.D.   Procedure Report  DATE OF BIRTH:  March 04, 1956  REFERRING PHYSICIAN:  Meliton Rattan, M.D.  PROCEDURE PERFORMED:  Colonoscopy with snare polypectomy x 1.  ENDOSCOPIST:  Anselmo Rod, M.D.  INSTRUMENT USED:  Olympus video colonoscope.  INDICATIONS FOR PROCEDURE:  Family history of colon cancer (in father) in a 16 year old black female rule out colonic polyps, masses, etc.  PREPROCEDURE PREPARATION:  Informed consent was procured from the patient. The patient was fasted for eight hours prior to the procedure and prepped with a bottle of magnesium citrate and a gallon of NuLytely the night prior to the procedure.  PREPROCEDURE PHYSICAL:  The patient had stable vital signs.  Neck supple. Chest clear to auscultation.  S1, S2 regular.  Abdomen soft with normal abdominal bowel sounds.  DESCRIPTION OF PROCEDURE:  The patient was placed in the left lateral decubitus position and sedated with 50 mg of Demerol and 5 mg of Versed intravenously.  Once the patient was adequately sedated and maintained on low-flow oxygen and continuous cardiac monitoring, the Olympus video colonoscope was advanced from the rectum to the cecum without difficulty. There was some residual stool in the right colon but visualization was adequate.  The patient had scattered early diverticular changes at the hepatic flexure.  A small polyp was snared from 25 cm and the patient had small internal hemorrhoids seen on retroflexion.  No other abnormalities were detected.  The patient tolerated the procedure well without complications.  IMPRESSION: 1. Small nonbleeding internal hemorrhoid. 2. A small sessile polyp snared at 25 cm. 3. 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 4. Some  residual stool in the right colon.  No large masses or polyps seen.  RECOMMENDATIONS: 1. Await pathology results. 2. Avoid nonsteroidals for the next 10 days. 3. Increase fluid and fiber in the diet. 4. Outpatient follow-up within the next month.DD:  03/07/00 TD:  03/07/00 Job: [id number]

## 2010-08-05 NOTE — Op Note (Signed)
NAMESHAWNTEL, FARNWORTH              ACCOUNT NO.:  [id number]   MEDICAL RECORD NO.:  [id number]          PATIENT TYPE:  AMB   LOCATION:  DSC                          FACILITY:  [facility]   PHYSICIAN:  Cindee Salt, M.D.       DATE OF BIRTH:  08-Jun-1955   DATE OF PROCEDURE:  09/22/2004  DATE OF DISCHARGE:                                 OPERATIVE REPORT   PREOPERATIVE DIAGNOSIS:  Carpal tunnel syndrome, right hand.   POSTOPERATIVE DIAGNOSIS:  Carpal tunnel syndrome, right hand.   OPERATION:  Decompression of right median nerve.   SURGEON:  Dr. Merlyn Lot.   ASSISTANT:  Joaquin Courts, R.N.   ANESTHESIA:  Forearm based IV regional.   HISTORY:  The patient is a 55 year old female with a history of carpal  tunnel syndrome. EMG nerve conductions positive which has not responded to  conservative treatment.   PROCEDURE:  The patient is brought to the operating room where a forearm  based IV regional anesthetic was carried out without difficulty. She was  prepped using DuraPrep, in the supine position, right arm free. A  longitudinal incision was made in the palm, carried down through the  subcutaneous tissue. Bleeders were electrocauterized. Palmar fascia was  split, superficial palmar arch identified. The flexor tendon to the ring  finger and little finger identified. The ulnar side of the median, nerve  carpal retinaculum was incised with sharp dissection, right angle and Sewell  retractor placed between skin and forearm fashion. The fascia was released  for approximately 1.5 cm proximal to the wrist crease under direct vision.  The canal was explored.  Tenosynovial tissue was mildly thick and a  persistent median artery was present. This had not thrombosed. The wound was  irrigated. The skin was closed with interrupted 5-0 nylon sutures. A sterile  compressive dressing and splint was applied. The patient tolerated the  procedure well was taken to the recovery room for observation in  satisfactory  condition. She is discharged home to return to the [facility] [place] in one week on Vicodin.       GK/MEDQ  D:  09/22/2004  T:  09/22/2004  Job:  [id number]

## 2010-08-05 NOTE — Consult Note (Signed)
[name], [name] NO.:  [id number]   MEDICAL RECORD NO.:  [id number]          PATIENT TYPE:  INP   LOCATION:  3732                         FACILITY:  [facility]   PHYSICIAN:  Pricilla Riffle, MD, FACCDATE OF BIRTH:  Oct 23, 1955   DATE OF CONSULTATION:  06/08/2006  DATE OF DISCHARGE:                                 CONSULTATION   IDENTIFICATION:  Mrs. [name] is a 55 year old woman who we are asked to  see regarding chest pain.   HISTORY OF PRESENT ILLNESS:  The patient has no known history of  coronary artery disease.  She has a history of hypertension that has  been treated.   She had a right shoulder injury 3 weeks ago at work.  She has begun  therapy.  She noted at work she was bending down to pick something up  when she began developing some chest pressure.  She described it as more  of a substernal.  When she would stand she would get a little  lightheaded with this.  No syncope.  Pain would not radiate.  No  shortness of breath or diaphoresis.  Ease off on its own.  It has  happened over the past few days.  It has not been brought on by any  other activity.  She denies any acid taste in her mouth.  She denies any  increased shortness of breath with activity.   ALLERGIES:  SULFA AND NEXIUM LEADING TO HEADACHE.   MEDICATIONS PRIOR TO ADMISSION:  1. Norvasc 10.  2. Hydrochlorothiazide, question dose.  3. Prilosec p.r.n.  4. Calcium 500 mg.  5. Fish oil 1 gram.  6. Vitamin B12.   Here in the hospital she has had aspirin 81 mg, Protonix 40 and Norvasc  10 mg daily.   PAST MEDICAL HISTORY:  1. Hypertension.  2. Shoulder injury.  3. GE reflux.  4. History of uterine fibroids surgically treated in July of 2003.  5. History of carpal tunnel the right.   PAST SURGICAL HISTORY:  1. Abdominal hysterectomy with bilateral salpingo-oophorectomy.  2. Carpal tunnel decompression.   SOCIAL HISTORY:  The patient lives in [place].  She has two children.  Does not smoke, does not drink.  Walks.  Prior to her shoulder injury  she was walking 30 minutes per day on the treadmill.  Denied any  problems with this.  Has cut back since she is going to therapy now.   FAMILY HISTORY:  Mother died of Alzheimer's at age 69.  Father died in  his 66s.  Had colon cancer. Also had a pacemaker.  Two brothers alive  with hypertension.  One sister with hypertension, increased lipids.  One  sister died of lung cancer.   REVIEW OF SYSTEMS:  As noted about a month ago lightheadedness with  walking, again very brief, less than 10 seconds.  Otherwise all systems  reviewed and negative to the above problem except as previously noted.   PHYSICAL EXAMINATION:  The patient currently in no distress.  Blood  pressure this morning was 98/53, pulse is 67 and temperature is 97.8.  O2  sat on room air was 100%.  The patient denies chest pain.  Denies  shortness of breath.  HEENT:  Normocephalic, atraumatic.  PERRL.  EOMI.  Throat clear.  NECK:  JVP is normal.  No thyromegaly.  No bruits.  CARDIAC:  Showed regular rate and rhythm, S1-S2.  No S3-S4 or murmurs.  PMI not displaced.  No clicks.  ABDOMEN:  Supple, nontender.  No hepatomegaly.  CHEST:  Nontender to palpation.  EXTREMITIES:  Good distal pulses throughout.  No lower extremity edema.  With standing and going to touch her toes unable to reproduce symptoms.  When she bent down to tie her shoe she did have slight palpitations.  NEUROLOGIC:  Alert and orient x3.  Cranial nerves II-XII intact.  Moving  all extremities.   Chest x-ray on June 07, 2006 shows mild cardiomegaly (portable).  EKG  on June 07, 2006 shows normal sinus rhythm at 68 beats per minute.  There was T-wave inversion V3, biphasic T-waves V4-V5 with trivial ST  depression at 19:07 that is slightly more prominent, though overall both  EKGs had less prominent changes than in July 2006.   LABORATORY DATA:  Labs significant for a hemoglobin of 12.2,  WBC of 4.4,  platelets 254, BUN and creatinine of 17 and 0.65, potassium of 3.7.  Total CK was 137/130, MB with 1.2/1.1/1.  Troponin less than 0.05; 0.01;  0.01.  Total cholesterol 272, HDL of 97, LDL of 187.   IMPRESSION:  The patient is a 55 year old woman admitted with chest pain  which is atypical for coronary ischemia.  With bending to standing she  gets slightly lightheaded and has had some pressure,  none with  exertion.  Exam could not reproduce completely today with maneuvers. EKG  is with changes, but these have been chronic.  Labs are significant for  the cardiac enzymes being negative, LDL though elevated at 187 with an  HDL also elevated at 97.   Would recommend:  1. Check orthostatics.  Continue medicines for now.  2. Question GI.  Agree with Protonix.  Could be a subclinical reflux.  3. Question muscular, but could not be provoked by pushing.  4. Await echo.  5. If above negative, would consider a Myoview.  This could be done as      an outpatient.  6. Begin statin at night.  Dietary to see.  40 mg Zocor.  7. Check TSH.      Pricilla Riffle, MD, [facility]  Electronically Signed    PVR/MEDQ  D:  06/08/2006  T:  06/08/2006  Job:  [id number]

## 2010-08-11 ENCOUNTER — Ambulatory Visit (HOSPITAL_COMMUNITY): Payer: Managed Care, Other (non HMO)

## 2010-08-25 ENCOUNTER — Ambulatory Visit (HOSPITAL_COMMUNITY): Payer: Managed Care, Other (non HMO)

## 2010-09-02 ENCOUNTER — Ambulatory Visit (HOSPITAL_COMMUNITY)
Admission: RE | Admit: 2010-09-02 | Discharge: 2010-09-02 | Disposition: A | Discharge status: Home / Self Care | Payer: PRIVATE HEALTH INSURANCE | Source: Ambulatory Visit | Attending: Obstetrics and Gynecology | Admitting: Obstetrics and Gynecology

## 2010-09-02 DIAGNOSIS — Z1231 Encounter for screening mammogram for malignant neoplasm of breast: Secondary | ICD-10-CM

## 2010-12-19 LAB — COMPREHENSIVE METABOLIC PANEL
ALT: 56 — ABNORMAL HIGH
AST: 23
AST: 42 — ABNORMAL HIGH
Albumin: 2.8 — ABNORMAL LOW
Alkaline Phosphatase: 85
BUN: 5 — ABNORMAL LOW
CO2: 31
CO2: 31
Calcium: 8.8
Calcium: 9
Chloride: 103
Chloride: 104
Chloride: 105
Creatinine, Ser: 0.64
Creatinine, Ser: 0.72
Creatinine, Ser: 0.73
GFR calc Af Amer: >60
GFR calc Af Amer: >60
GFR calc non Af Amer: >60
GFR calc non Af Amer: >60
Glucose, Bld: 111 — ABNORMAL HIGH
Glucose, Bld: 111 — ABNORMAL HIGH
Potassium: 3.9
Sodium: 138
Total Bilirubin: 0.6
Total Bilirubin: 0.6
Total Protein: 6.5

## 2010-12-19 LAB — CBC
HCT: 30.5 — ABNORMAL LOW
HCT: 32.6 — ABNORMAL LOW
Hemoglobin: 10.2 — ABNORMAL LOW
Hemoglobin: 10.9 — ABNORMAL LOW
Hemoglobin: 11 — ABNORMAL LOW
MCHC: 33
MCHC: 33.1
MCV: 85.9
MCV: 86.8
MCV: 87.2
MCV: 87.8
MCV: 88
Platelets: 219
Platelets: 248
Platelets: 360
RBC: 3.49 — ABNORMAL LOW
RBC: 3.64 — ABNORMAL LOW
RBC: 3.79 — ABNORMAL LOW
RBC: 3.83 — ABNORMAL LOW
RBC: 3.89
RDW: 14.8
WBC: 10.2
WBC: 11.9 — ABNORMAL HIGH
WBC: 15.3 — ABNORMAL HIGH
WBC: 6.8
WBC: 7.3

## 2010-12-19 LAB — DIFFERENTIAL
Basophils Absolute: 0
Eosinophils Relative: 1
Lymphocytes Relative: 6 — ABNORMAL LOW
Lymphocytes Relative: 7 — ABNORMAL LOW
Lymphocytes Relative: 8 — ABNORMAL LOW
Lymphs Abs: 0.8
Lymphs Abs: 1.6
Monocytes Relative: 4
Monocytes Relative: 7
Neutro Abs: 10.3 — ABNORMAL HIGH
Neutrophils Relative %: 86 — ABNORMAL HIGH
Neutrophils Relative %: 89 — ABNORMAL HIGH
WBC Morphology: INCREASED

## 2010-12-19 LAB — CULTURE, ROUTINE-ABSCESS

## 2010-12-19 LAB — HEPATITIS PANEL, ACUTE
Hep A IgM: NEGATIVE
Hep B C IgM: NEGATIVE

## 2010-12-19 LAB — VANCOMYCIN, TROUGH: Vancomycin Tr: 9.5 — ABNORMAL LOW

## 2011-01-04 LAB — BASIC METABOLIC PANEL
CO2: 31
Glucose, Bld: 80
Potassium: 4
Sodium: 141

## 2011-03-18 ENCOUNTER — Telehealth: Payer: Self-pay | Admitting: Gastroenterology

## 2011-03-18 NOTE — Telephone Encounter (Signed)
On call note at 2000 on 12/28. Pt followed by Dr. Loreta Ave and noted a "spot" of blood on tissue paper when wiping after BM this evening. She related a history of polyps and hemorrhoids on prior colonoscopy. No other symptoms. Advised to monitor for increased bleeding, other new GI symptoms and contact us or go to ED if symptoms worsen. Advised to call Dr. Kenna Gilbert office on Monday for further advice.

## 2011-05-09 ENCOUNTER — Other Ambulatory Visit: Payer: Self-pay | Admitting: Internal Medicine

## 2011-05-09 DIAGNOSIS — R10811 Right upper quadrant abdominal tenderness: Secondary | ICD-10-CM

## 2011-05-11 ENCOUNTER — Ambulatory Visit
Admission: RE | Admit: 2011-05-11 | Discharge: 2011-05-11 | Disposition: A | Discharge status: Home / Self Care | Payer: 59 | Source: Ambulatory Visit | Attending: Internal Medicine | Admitting: Internal Medicine

## 2011-05-11 DIAGNOSIS — R10811 Right upper quadrant abdominal tenderness: Secondary | ICD-10-CM

## 2011-08-28 ENCOUNTER — Other Ambulatory Visit (HOSPITAL_COMMUNITY): Payer: Self-pay | Admitting: Obstetrics and Gynecology

## 2011-08-28 DIAGNOSIS — Z1231 Encounter for screening mammogram for malignant neoplasm of breast: Secondary | ICD-10-CM

## 2011-09-25 ENCOUNTER — Ambulatory Visit (HOSPITAL_COMMUNITY): Payer: 59

## 2011-11-28 ENCOUNTER — Ambulatory Visit (HOSPITAL_COMMUNITY): Payer: 59

## 2011-12-19 ENCOUNTER — Other Ambulatory Visit: Payer: Self-pay | Admitting: Obstetrics and Gynecology

## 2011-12-19 DIAGNOSIS — Z1231 Encounter for screening mammogram for malignant neoplasm of breast: Secondary | ICD-10-CM

## 2011-12-21 ENCOUNTER — Encounter (HOSPITAL_COMMUNITY): Payer: Self-pay | Admitting: *Deleted

## 2012-01-02 ENCOUNTER — Ambulatory Visit (HOSPITAL_COMMUNITY)
Admission: RE | Admit: 2012-01-02 | Discharge: 2012-01-02 | Disposition: A | Discharge status: Home / Self Care | Payer: Self-pay | Source: Ambulatory Visit | Attending: Obstetrics and Gynecology | Admitting: Obstetrics and Gynecology

## 2012-01-02 ENCOUNTER — Encounter (HOSPITAL_COMMUNITY): Payer: Self-pay

## 2012-01-02 ENCOUNTER — Ambulatory Visit (HOSPITAL_COMMUNITY)
Admission: RE | Admit: 2012-01-02 | Discharge: 2012-01-02 | Disposition: A | Discharge status: Home / Self Care | Payer: 59 | Source: Ambulatory Visit | Attending: Obstetrics and Gynecology | Admitting: Obstetrics and Gynecology

## 2012-01-02 VITALS — BP 118/86 | Temp 98.2°F | Ht 66.0 in | Wt 176.4 lb

## 2012-01-02 DIAGNOSIS — Z1231 Encounter for screening mammogram for malignant neoplasm of breast: Secondary | ICD-10-CM

## 2012-01-02 DIAGNOSIS — Z1239 Encounter for other screening for malignant neoplasm of breast: Secondary | ICD-10-CM

## 2012-01-02 HISTORY — DX: Gastro-esophageal reflux disease without esophagitis: K21.9

## 2012-01-02 HISTORY — DX: Essential (primary) hypertension: I10

## 2012-01-02 HISTORY — DX: Hyperlipidemia, unspecified: E78.5

## 2012-01-02 NOTE — Patient Instructions (Signed)
Taught patient how to perform BSE and gave educational materials to take home. Patient did not need a Pap smear today due to last Pap smear was in December 2011 per patient. Let patient know she no longer needs Pap smears since she has a history of a hysterectomy for benign reasons in 2003. Patient escorted to mammography for a screening mammogram.  Let patient know will follow up with her within the next couple weeks with results. Patient verbalized understanding.

## 2012-01-02 NOTE — Progress Notes (Signed)
No complaints today. Last screening mammogram was 09/02/2010 at [facility].  Pap Smear:    Pap smear not performed today. Per patient last Pap smear was December 2011 and normal. Per patient has a history of an abnormal Pap smear 20+ years ago that required a repeat Pap smear. Per patient all other Pap smears were normal. Patient has a history of a hysterectomy in 2003 for benign reasons.  No Pap smear results in EPIC.  Physical exam: Breasts Breasts symmetrical. No skin abnormalities bilateral breasts. No nipple retraction bilateral breasts. No nipple discharge bilateral breasts. No lymphadenopathy. No lumps palpated bilateral breasts. No complaints of pain or tenderness on exam. Screening mammogram scheduled after BCCCP appointment at [facility].         Pelvic/Bimanual No Pap smear completed today since last Pap smear was December 2011 and normal per patient. Patient has a history of a hysterectomy for benign reasons therefore Pap smear not indicated per BCCCP guidelines.

## 2012-03-21 NOTE — Addendum Note (Signed)
Encounter addended by: Saintclair Halsted, RN on: 03/21/2012  3:26 PM<BR>     Documentation filed: Visit Diagnoses, Charges VN

## 2012-08-13 ENCOUNTER — Other Ambulatory Visit: Payer: Self-pay | Admitting: Orthopedic Surgery

## 2012-08-13 DIAGNOSIS — M542 Cervicalgia: Secondary | ICD-10-CM

## 2012-08-13 DIAGNOSIS — M25512 Pain in left shoulder: Secondary | ICD-10-CM

## 2012-08-18 ENCOUNTER — Ambulatory Visit
Admission: RE | Admit: 2012-08-18 | Discharge: 2012-08-18 | Disposition: A | Discharge status: Home / Self Care | Payer: BC Managed Care – PPO | Source: Ambulatory Visit | Attending: Orthopedic Surgery | Admitting: Orthopedic Surgery

## 2012-08-18 DIAGNOSIS — M25512 Pain in left shoulder: Secondary | ICD-10-CM

## 2012-08-18 DIAGNOSIS — M542 Cervicalgia: Secondary | ICD-10-CM

## 2012-12-16 ENCOUNTER — Other Ambulatory Visit (HOSPITAL_COMMUNITY): Payer: Self-pay | Admitting: Internal Medicine

## 2012-12-16 DIAGNOSIS — Z1231 Encounter for screening mammogram for malignant neoplasm of breast: Secondary | ICD-10-CM

## 2012-12-23 ENCOUNTER — Other Ambulatory Visit: Payer: Self-pay | Admitting: Gastroenterology

## 2012-12-23 DIAGNOSIS — R1013 Epigastric pain: Secondary | ICD-10-CM

## 2012-12-27 ENCOUNTER — Other Ambulatory Visit: Payer: BC Managed Care – PPO

## 2013-01-01 ENCOUNTER — Ambulatory Visit
Admission: RE | Admit: 2013-01-01 | Discharge: 2013-01-01 | Disposition: A | Discharge status: Home / Self Care | Payer: BC Managed Care – PPO | Source: Ambulatory Visit | Attending: Gastroenterology | Admitting: Gastroenterology

## 2013-01-01 DIAGNOSIS — R1013 Epigastric pain: Secondary | ICD-10-CM

## 2013-01-13 ENCOUNTER — Ambulatory Visit (HOSPITAL_COMMUNITY)
Admission: RE | Admit: 2013-01-13 | Discharge: 2013-01-13 | Disposition: A | Discharge status: Home / Self Care | Payer: BC Managed Care – PPO | Source: Ambulatory Visit | Attending: Internal Medicine | Admitting: Internal Medicine

## 2013-01-13 DIAGNOSIS — Z1231 Encounter for screening mammogram for malignant neoplasm of breast: Secondary | ICD-10-CM

## 2013-08-27 ENCOUNTER — Ambulatory Visit (INDEPENDENT_AMBULATORY_CARE_PROVIDER_SITE_OTHER): Payer: Self-pay | Admitting: General Surgery

## 2013-09-22 ENCOUNTER — Ambulatory Visit (INDEPENDENT_AMBULATORY_CARE_PROVIDER_SITE_OTHER): Payer: BC Managed Care – PPO | Admitting: General Surgery

## 2013-12-30 ENCOUNTER — Other Ambulatory Visit (HOSPITAL_COMMUNITY): Payer: Self-pay | Admitting: Obstetrics and Gynecology

## 2013-12-30 DIAGNOSIS — Z1231 Encounter for screening mammogram for malignant neoplasm of breast: Secondary | ICD-10-CM

## 2014-01-14 ENCOUNTER — Ambulatory Visit (HOSPITAL_COMMUNITY)
Admission: RE | Admit: 2014-01-14 | Discharge: 2014-01-14 | Disposition: A | Discharge status: Home / Self Care | Payer: BC Managed Care – PPO | Source: Ambulatory Visit | Attending: Obstetrics and Gynecology | Admitting: Obstetrics and Gynecology

## 2014-01-14 DIAGNOSIS — Z1231 Encounter for screening mammogram for malignant neoplasm of breast: Secondary | ICD-10-CM | POA: Insufficient documentation

## 2014-01-19 ENCOUNTER — Encounter (HOSPITAL_COMMUNITY): Payer: Self-pay

## 2014-12-23 ENCOUNTER — Other Ambulatory Visit: Payer: Self-pay

## 2014-12-23 DIAGNOSIS — Z1231 Encounter for screening mammogram for malignant neoplasm of breast: Secondary | ICD-10-CM

## 2015-01-21 ENCOUNTER — Ambulatory Visit
Admission: RE | Admit: 2015-01-21 | Discharge: 2015-01-21 | Disposition: A | Discharge status: Home / Self Care | Payer: 59 | Source: Ambulatory Visit

## 2015-01-21 DIAGNOSIS — Z1231 Encounter for screening mammogram for malignant neoplasm of breast: Secondary | ICD-10-CM

## 2016-01-11 ENCOUNTER — Other Ambulatory Visit: Payer: Self-pay | Admitting: Internal Medicine

## 2016-01-11 DIAGNOSIS — Z1231 Encounter for screening mammogram for malignant neoplasm of breast: Secondary | ICD-10-CM

## 2016-02-14 ENCOUNTER — Emergency Department (HOSPITAL_COMMUNITY): Admission: EM | Admit: 2016-02-14 | Discharge: 2016-02-14 | Discharge status: Against Medical Advice | Payer: Self-pay

## 2016-02-25 ENCOUNTER — Ambulatory Visit: Payer: Self-pay

## 2016-03-31 ENCOUNTER — Ambulatory Visit: Payer: Self-pay | Admitting: Neurology

## 2016-03-31 ENCOUNTER — Telehealth: Payer: Self-pay | Admitting: Neurology

## 2016-03-31 NOTE — Telephone Encounter (Signed)
This patient canceled within 30 minutes of appointment time.

## 2016-04-03 ENCOUNTER — Encounter: Payer: Self-pay | Admitting: Neurology

## 2016-04-11 ENCOUNTER — Ambulatory Visit
Admission: RE | Admit: 2016-04-11 | Discharge: 2016-04-11 | Disposition: A | Discharge status: Home / Self Care | Payer: BLUE CROSS/BLUE SHIELD | Source: Ambulatory Visit | Attending: Internal Medicine | Admitting: Internal Medicine

## 2016-04-11 DIAGNOSIS — Z1231 Encounter for screening mammogram for malignant neoplasm of breast: Secondary | ICD-10-CM

## 2016-04-12 ENCOUNTER — Ambulatory Visit (INDEPENDENT_AMBULATORY_CARE_PROVIDER_SITE_OTHER): Payer: BLUE CROSS/BLUE SHIELD | Admitting: Neurology

## 2016-04-12 ENCOUNTER — Encounter: Payer: Self-pay | Admitting: Neurology

## 2016-04-12 VITALS — BP 135/80 | HR 77 | Resp 20 | Ht 66.0 in | Wt 182.0 lb

## 2016-04-12 DIAGNOSIS — I10 Essential (primary) hypertension: Secondary | ICD-10-CM

## 2016-04-12 DIAGNOSIS — R51 Headache: Secondary | ICD-10-CM | POA: Diagnosis not present

## 2016-04-12 DIAGNOSIS — R519 Headache, unspecified: Secondary | ICD-10-CM

## 2016-04-12 MED ORDER — SALINE SPRAY 0.65 % NA SOLN: 1.0000 | NASAL | 0 refills | Status: AC | PRN

## 2016-04-12 MED ORDER — MOMETASONE FUROATE 50 MCG/ACT NA SUSP: 2.0000 | Freq: Every day | NASAL | 12 refills | Status: DC

## 2016-04-12 NOTE — Progress Notes (Signed)
This patient canceled within 30 minutes of appointment time. NO SHOW with Dr. Jannifer Franklin.     MEDICINE CLINIC   Provider:  Larey Seat, M D  Referring Provider: No ref. provider found Primary Care Physician:  No primary care provider on file.  Chief Complaint  Patient presents with  . New Patient (Initial Visit)    headaches after eating ham, 2-3 times per week    HPI:  [name] is a 61 y.o. female , seen here as a referral from Dr. Shelia Media, @ [facility],  Dr. Shelia Media reports that [name] has had severe headaches ever since late November, when she suffered from very high blood pressures the night after eating a salty ham. She called her doctor who recommended to take an extra blood pressure pill. That night her blood pressure decreased but she still felt somewhat ill and had a head pain or headache that lasted from there weeks. She took an extra dose of valsartan-hydrochlorothiazide. She also is on vitamin D, multivitamins, calcium, Dexalone, Valtrex, Zetia, and amlodipine. She did not report visual changes, no visual aura, but she did have some photophobia. She does not report phonophobia, she was not sleep deprived at the time of onset of headaches and the headaches did not keep her from sleeping. She described a throbbing pain in her forehead, not retro-orbital , not temporal. The headaches radiated to the top of her head. No allodynia or hyperesthesia.  Just last Sunday she had again experienced headaches with them the for head, not lateralized, not associated with nausea or vomiting and she felt very congested. She reports feeling clogged up.  Medical history and family history: HTN, herpes, GI upset, palpitations last in 2015( Dr. Einar Gip) , CAD and HTN in maternal family, 2 siblings. Father had CAD, atrial fib, pacemaker.   Social history:  Lives with her adult daughter, retired from Therapist, art. No nicotine use, no ETOH use- 1 glass a year. Caffeine -  not daily.   Review of Systems: Out of a complete 14 system review, the patient complains of only the following symptoms, and all other reviewed systems are negative. The patient presents with headaches that were first arising with a period of high blood pressure. Once the blood pressures were controlled the headaches improved they did not go away. They don't seem to be migrainous in nature rather hypertension or sinusitis related, depression score 1/15    Social History   Social History  . Marital status: Widowed    Spouse name: N/A  . Number of children: N/A  . Years of education: N/A   Occupational History  . Not on file.   Social History Main Topics  . Smoking status: Never Smoker  . Smokeless tobacco: Never Used  . Alcohol use 0.6 oz/week    1 Standard drinks or equivalent per week  . Drug use: No  . Sexual activity: Not Currently   Other Topics Concern  . Not on file   Social History Narrative  . No narrative on file    Family History  Problem Relation Age of Onset  . Diabetes Sister   . Hypertension Brother   . Alzheimer's disease Mother   . Prostate cancer Father   . Colon cancer Father     Past Medical History:  Diagnosis Date  . AC (acromioclavicular) joint bone spurs, unspecified laterality   . Arthritis   . Chronic low back pain   . GERD (gastroesophageal reflux disease)   .  Hyperlipemia   . Hypertension   . Urine incontinence     Past Surgical History:  Procedure Laterality Date  . ABDOMINAL HYSTERECTOMY  2003  . CARPAL TUNNEL RELEASE    . HERNIA REPAIR    . SHOULDER SURGERY      Current Outpatient Prescriptions  Medication Sig Dispense Refill  . amLODipine (NORVASC) 10 MG tablet Take 10 mg by mouth daily.    . calcium carbonate (CALCIUM 600) 600 MG TABS tablet Take 600 mg by mouth 2 (two) times daily with a meal.    . dexlansoprazole (DEXILANT) 60 MG capsule Take 60 mg by mouth daily.    Marland Kitchen ezetimibe (ZETIA) 10 MG tablet Take 10 mg by  mouth daily.    . Probiotic Product (PROBIOTIC-10) CAPS Take by mouth.    . valACYclovir (VALTREX) 1000 MG tablet Take 1,000 mg by mouth daily.    . valsartan-hydrochlorothiazide (DIOVAN-HCT) 160-12.5 MG tablet Take 1 tablet by mouth daily.    . Vitamin D, Cholecalciferol, 400 units CAPS Take 400 Units by mouth 2 (two) times daily.     No current facility-administered medications for this visit.     Allergies as of 04/12/2016 - Review Complete 04/12/2016  Allergen Reaction Noted  . Carafate [sucralfate]  04/12/2016  . Crestor [rosuvastatin calcium]  04/12/2016  . Lipitor [atorvastatin]  04/12/2016  . Lovastatin  04/12/2016  . Macrobid [nitrofurantoin macrocrystal]  04/12/2016  . Sulfa antibiotics  01/02/2012  . Zocor [simvastatin]  04/12/2016    Vitals: BP 135/80   Pulse 77   Resp 20   Ht 5\' 6"  (1.676 m)   Wt 182 lb (82.6 kg)   BMI 29.38 kg/m  Last Weight:  Wt Readings from Last 1 Encounters:  04/12/16 182 lb (82.6 kg)   [id number] mass index is 29.38 kg/m.     Last Height:   Ht Readings from Last 1 Encounters:  04/12/16 5\' 6"  (1.676 m)    Physical exam:  General: The patient is awake, alert and appears not in acute distress. The patient is well groomed. Head: Normocephalic, atraumatic. Neck is supple. Mallampati   Retrognathia is seen.  Cardiovascular:  Regular rate and rhythm , without  murmurs or carotid bruit, and without distended neck veins. Respiratory: Lungs are clear to auscultation. Skin:  Without evidence of edema, or rash Trunk: BMI is 29  The patient's posture is erect   Neurologic exam : The patient is awake and alert, oriented to place and time.    Attention span & concentration ability appears normal.  Speech is fluent,   Mood and affect are appropriate.  Cranial nerves: Pupils are equal and briskly reactive to light. Funduscopic exam : I am unable to appreciate the fundi-  Extraocular movements  in vertical and horizontal planes intact and without  nystagmus. Visual fields by finger perimetry are intact. Hearing to finger rub intact.   Facial sensation intact to fine touch.  Facial motor strength is symmetric and tongue and uvula move midline. Shoulder shrug was symmetrical.   Motor exam: Normal tone, muscle bulk and symmetric strength in all extremities.  Sensory:  Fine touch, pinprick and vibration were tested in all extremities. Proprioception tested in the upper extremities was normal. Coordination: Rapid alternating movements in the fingers/hands was normal. Finger-to-nose maneuver  normal without evidence of ataxia, dysmetria or tremor. Gait and station: Patient walks without assistive device and is able unassisted to climb up to the exam table. Strength within normal limits.  Stance is stable  and normal.   Deep tendon reflexes: in the  upper and lower extremities are symmetric and intact. Babinski maneuver response is  downgoing.  The patient was advised of the nature of the diagnosed sleep disorder , the treatment options and risks for general a health and wellness arising from not treating the condition.  I spent more than 45 minutes of face to face time with the patient. Greater than 50% of time was spent in counseling and coordination of care. We have discussed the diagnosis and differential and I answered the patient's questions.     Assessment:  After physical and neurologic examination, review of laboratory studies,  Personal review of imaging studies, reports of other /same  Imaging studies ,  Results of polysomnography/ neurophysiology testing and pre-existing records as far as provided in visit., my assessment is   1) BP 170/ 90 mmHg the night of headaches onset, followed by weeks of congestion, dull and throbbing HA, runny nose.  Nasacort nasal spray has already helped her some. I think this is a mixture of the hypertension related headaches plus either a virus or allergic rhinitis and sinusitis. The headaches do not seem  to be vascular in nature. She has not had any remaining deficits. z pack was given the same time her blood pressure went up and she did feel sick from it. Obviously the Z-Pak would not be prescribed if there wouldn't be a possible infection. The infection she has by now overcome  Plan:  Treatment plan and additional workup :  I recommend a saline nasal spray twice a day use, if needed for sinusitis infectious rhinitis and congestion she can return to Nasacort when necessary. I like for her to take just over-the-counter Tylenol or ibuprofen for headaches associated with febrile illness. Obviously, she will have to continue to use a low-salt diet and take her 3 antihypertensives.  I would like to investigate if she has suffered any kind of small vessel injury or mini stroke that could explain why her symptoms lasted so much longer than the blood pressure attack itself. However she is getting better, there is no urgency. Printed DASH diet ,    Asencion Partridge Cosette Prindle MD  04/12/2016   CC:  Janit Bern, MD   And Deland Pretty, MD

## 2016-04-12 NOTE — Patient Instructions (Signed)
DASH Eating Plan DASH stands for "Dietary Approaches to Stop Hypertension." The DASH eating plan is a healthy eating plan that has been shown to reduce high blood pressure (hypertension). Additional health benefits may include reducing the risk of type 2 diabetes mellitus, heart disease, and stroke. The DASH eating plan may also help with weight loss. What do I need to know about the DASH eating plan? For the DASH eating plan, you will follow these general guidelines:  Choose foods with less than 150 milligrams of sodium per serving (as listed on the food label).  Use salt-free seasonings or herbs instead of table salt or sea salt.  Check with your health care provider or pharmacist before using salt substitutes.  Eat lower-sodium products. These are often labeled as "low-sodium" or "no salt added."  Eat fresh foods. Avoid eating a lot of canned foods.  Eat more vegetables, fruits, and low-fat dairy products.  Choose whole grains. Look for the word "whole" as the first word in the ingredient list.  Choose fish and skinless chicken or turkey more often than red meat. Limit fish, poultry, and meat to 6 oz (170 g) each day.  Limit sweets, desserts, sugars, and sugary drinks.  Choose heart-healthy fats.  Eat more home-cooked food and less restaurant, buffet, and fast food.  Limit fried foods.  Do not fry foods. Cook foods using methods such as baking, boiling, grilling, and broiling instead.  When eating at a restaurant, ask that your food be prepared with less salt, or no salt if possible. What foods can I eat? Seek help from a dietitian for individual calorie needs. Grains  Whole grain or whole wheat bread. Brown rice. Whole grain or whole wheat pasta. Quinoa, bulgur, and whole grain cereals. Low-sodium cereals. Corn or whole wheat flour tortillas. Whole grain cornbread. Whole grain crackers. Low-sodium crackers. Vegetables  Fresh or frozen vegetables (raw, steamed, roasted, or  grilled). Low-sodium or reduced-sodium tomato and vegetable juices. Low-sodium or reduced-sodium tomato sauce and paste. Low-sodium or reduced-sodium canned vegetables. Fruits  All fresh, canned (in natural juice), or frozen fruits. Meat and Other Protein Products  Ground beef (85% or leaner), grass-fed beef, or beef trimmed of fat. Skinless chicken or turkey. Ground chicken or turkey. Pork trimmed of fat. All fish and seafood. Eggs. Dried beans, peas, or lentils. Unsalted nuts and seeds. Unsalted canned beans. Dairy  Low-fat dairy products, such as skim or 1% milk, 2% or reduced-fat cheeses, low-fat ricotta or cottage cheese, or plain low-fat yogurt. Low-sodium or reduced-sodium cheeses. Fats and Oils  Tub margarines without trans fats. Light or reduced-fat mayonnaise and salad dressings (reduced sodium). Avocado. Safflower, olive, or canola oils. Natural peanut or almond butter. Other  Unsalted popcorn and pretzels. The items listed above may not be a complete list of recommended foods or beverages. Contact your dietitian for more options.  What foods are not recommended? Grains  White bread. White pasta. White rice. Refined cornbread. Bagels and croissants. Crackers that contain trans fat. Vegetables  Creamed or fried vegetables. Vegetables in a cheese sauce. Regular canned vegetables. Regular canned tomato sauce and paste. Regular tomato and vegetable juices. Fruits  Canned fruit in light or heavy syrup. Fruit juice. Meat and Other Protein Products  Fatty cuts of meat. Ribs, chicken wings, bacon, sausage, bologna, salami, chitterlings, fatback, hot dogs, bratwurst, and packaged luncheon meats. Salted nuts and seeds. Canned beans with salt. Dairy  Whole or 2% milk, cream, half-and-half, and cream cheese. Whole-fat or sweetened yogurt. Full-fat cheeses   or blue cheese. Nondairy creamers and whipped toppings. Processed cheese, cheese spreads, or cheese curds. Condiments  Onion and garlic  salt, seasoned salt, table salt, and sea salt. Canned and packaged gravies. Worcestershire sauce. Tartar sauce. Barbecue sauce. Teriyaki sauce. Soy sauce, including reduced sodium. Steak sauce. Fish sauce. Oyster sauce. Cocktail sauce. Horseradish. Ketchup and mustard. Meat flavorings and tenderizers. Bouillon cubes. Hot sauce. Tabasco sauce. Marinades. Taco seasonings. Relishes. Fats and Oils  Butter, stick margarine, lard, shortening, ghee, and bacon fat. Coconut, palm kernel, or palm oils. Regular salad dressings. Other  Pickles and olives. Salted popcorn and pretzels. The items listed above may not be a complete list of foods and beverages to avoid. Contact your dietitian for more information.  Where can I find more information? National Heart, Lung, and Blood Institute: www.nhlbi.nih.gov/health/health-topics/topics/dash/ This information is not intended to replace advice given to you by your health care provider. Make sure you discuss any questions you have with your health care provider. Document Released: 02/23/2011 Document Revised: 08/12/2015 Document Reviewed: 01/08/2013 Elsevier Interactive Patient Education  2017 Elsevier Inc.  

## 2016-04-26 ENCOUNTER — Ambulatory Visit: Payer: Self-pay | Admitting: Neurology

## 2016-05-01 ENCOUNTER — Telehealth: Payer: Self-pay | Admitting: Neurology

## 2016-05-01 NOTE — Telephone Encounter (Signed)
Patient called office in reference to continuous of headaches.  Scheduled with Cecille Rubin 05/16/16 @ 9:45am.

## 2016-05-02 ENCOUNTER — Telehealth: Payer: Self-pay | Admitting: Neurology

## 2016-05-02 DIAGNOSIS — I10 Essential (primary) hypertension: Secondary | ICD-10-CM

## 2016-05-02 DIAGNOSIS — G441 Vascular headache, not elsewhere classified: Secondary | ICD-10-CM

## 2016-05-02 NOTE — Telephone Encounter (Signed)
Noted  

## 2016-05-03 ENCOUNTER — Telehealth: Payer: Self-pay | Admitting: Nurse Practitioner

## 2016-05-03 ENCOUNTER — Telehealth: Payer: Self-pay | Admitting: Neurology

## 2016-05-03 DIAGNOSIS — R51 Headache: Secondary | ICD-10-CM

## 2016-05-03 DIAGNOSIS — I10 Essential (primary) hypertension: Secondary | ICD-10-CM

## 2016-05-03 DIAGNOSIS — R519 Headache, unspecified: Secondary | ICD-10-CM

## 2016-05-03 NOTE — Telephone Encounter (Signed)
LMVM for [name] with MR at [facility].(Dr. Pharr's office).  Looking to see if pt has had recent labs done (BUN CREAT).  Needs within the last 6 wks.  I left my fax # and to let me know either way.

## 2016-05-03 NOTE — Telephone Encounter (Signed)
I ordered labs for MRI contrast. At [facility] per patient's request.

## 2016-05-03 NOTE — Telephone Encounter (Signed)
[name] with [facility] contacted me and informed me that the patient wants to have her MRI done there but she needs to get lab works for the BUN and creatinine.[name]

## 2016-05-03 NOTE — Telephone Encounter (Signed)
[facility] [phone] called inquiring about precert and pt needs labwork within 6 weeks.  Please call

## 2016-05-03 NOTE — Telephone Encounter (Signed)
l called [name] back and had to leave a voicemail for her to call me back.

## 2016-05-04 ENCOUNTER — Other Ambulatory Visit: Payer: Self-pay

## 2016-05-04 DIAGNOSIS — Z79899 Other long term (current) drug therapy: Secondary | ICD-10-CM

## 2016-05-04 NOTE — Telephone Encounter (Signed)
It appears that [place], RN already spoke to pt, and pt knows to come have lab work prior to her MRI.

## 2016-05-04 NOTE — Telephone Encounter (Signed)
I called and spoke to pt and she will come in tomorrow for labs to be drawn for her MRI.  Orders in per Dr. Brett Fairy.  Pt spoke about having done at [place] on [address]. (she was claustrophobic).  Pt has spoken to [facility] and they have given her in network options.  She has to have done by 06-01-16.

## 2016-05-04 NOTE — Telephone Encounter (Signed)
Received fax that no labs since 7-17 from [facility].

## 2016-05-05 ENCOUNTER — Other Ambulatory Visit (INDEPENDENT_AMBULATORY_CARE_PROVIDER_SITE_OTHER): Payer: Self-pay

## 2016-05-05 DIAGNOSIS — Z79899 Other long term (current) drug therapy: Secondary | ICD-10-CM

## 2016-05-05 DIAGNOSIS — Z0289 Encounter for other administrative examinations: Secondary | ICD-10-CM

## 2016-05-05 NOTE — Telephone Encounter (Signed)
Pt here today and had labs this am.

## 2016-05-06 LAB — CREATININE, SERUM
Creatinine, Ser: 0.82 mg/dL (ref 0.57–1.00)
GFR, EST AFRICAN AMERICAN: 89 mL/min/{1.73_m2} (ref >59)
GFR, EST NON AFRICAN AMERICAN: 77 mL/min/{1.73_m2} (ref >59)

## 2016-05-06 LAB — BUN: BUN: 11 mg/dL (ref 8–27)

## 2016-05-09 ENCOUNTER — Ambulatory Visit (INDEPENDENT_AMBULATORY_CARE_PROVIDER_SITE_OTHER): Payer: Self-pay

## 2016-05-09 DIAGNOSIS — G441 Vascular headache, not elsewhere classified: Secondary | ICD-10-CM | POA: Diagnosis not present

## 2016-05-09 DIAGNOSIS — Z0289 Encounter for other administrative examinations: Secondary | ICD-10-CM

## 2016-05-09 DIAGNOSIS — I10 Essential (primary) hypertension: Secondary | ICD-10-CM

## 2016-05-12 ENCOUNTER — Telehealth: Payer: Self-pay

## 2016-05-12 NOTE — Telephone Encounter (Signed)
I called pt. I advised her that her lab work was norma, per Dr. Brett Fairy. I also advised pt that per Dr. Brett Fairy, her MRI brain showed some microvascular changes, likely aging or migraine related process. Pt says that she does not have migraines. Pt says that she will continue on the DASH diet and will avoid eating salt because salt triggered a headache for her in the past. Pt said that she will keep her appt with [name], NP on 05/16/2016 to discuss her medications and where to go from here. Pt verbalized understanding of results. Pt had no questions at this time but was encouraged to call back if questions arise.

## 2016-05-12 NOTE — Telephone Encounter (Signed)
-----   Message from Larey Seat, MD sent at 05/11/2016  5:09 PM EST ----- No acute findings, There are some microvascular changes, likely aging or migraine related  process. CD

## 2016-05-12 NOTE — Telephone Encounter (Signed)
-----   Message from Larey Seat, MD sent at 05/11/2016  5:07 PM EST ----- Normal lab results.

## 2016-05-16 ENCOUNTER — Encounter: Payer: Self-pay | Admitting: Nurse Practitioner

## 2016-05-16 ENCOUNTER — Ambulatory Visit (INDEPENDENT_AMBULATORY_CARE_PROVIDER_SITE_OTHER): Payer: BLUE CROSS/BLUE SHIELD | Admitting: Nurse Practitioner

## 2016-05-16 DIAGNOSIS — E785 Hyperlipidemia, unspecified: Secondary | ICD-10-CM | POA: Diagnosis not present

## 2016-05-16 DIAGNOSIS — R51 Headache: Secondary | ICD-10-CM | POA: Diagnosis not present

## 2016-05-16 DIAGNOSIS — R519 Headache, unspecified: Secondary | ICD-10-CM

## 2016-05-16 DIAGNOSIS — I10 Essential (primary) hypertension: Secondary | ICD-10-CM | POA: Diagnosis not present

## 2016-05-16 NOTE — Progress Notes (Signed)
I agree with the assessment and plan as directed by NP .The patient is known to me .   Herve Haug, MD  

## 2016-05-16 NOTE — Patient Instructions (Addendum)
B/P well controlled at 125/73 Continue DASH diet  Avoid pork products Call for severe headaches Follow up prn only    STUDY DATE: 05/09/16 PATIENT NAME: [name] DOB: 1955/12/17 MRN: [id number]  ORDERING CLINICIAN: Larey Seat, MD  CLINICAL HISTORY: 61 year old female with headaches.  EXAM: MRI brain (with and without)  TECHNIQUE: MRI of the brain with and without contrast was obtained utilizing 5 mm axial slices with T1, T2, T2 flair, T2 star gradient echo and diffusion weighted views.  T1 sagittal, T2 coronal and postcontrast views in the axial and coronal plane were obtained. CONTRAST: 6ml gadavist  IMAGING SITE: [facility] (1.2 Tesla MRI)   FINDINGS:  No abnormal lesions are seen on diffusion-weighted views to suggest acute ischemia. The cortical sulci, fissures and cisterns are normal in size and appearance. Lateral, third and fourth ventricle are normal in size and appearance. No extra-axial fluid collections are seen. No evidence of mass effect or midline shift.    Mild scattered periventricular and subcortical foci of non-specific gliosis.  No abnormal lesions are seen on post contrast views.  On sagittal views the posterior fossa, pituitary gland and corpus callosum are unremarkable. No evidence of intracranial hemorrhage on gradient-echo views. The orbits and their contents, paranasal sinuses and calvarium are unremarkable.  Intracranial flow voids are present.   IMPRESSION:  Mildly abnormal MRI brain (with and without) demonstrating: 1. Mild scattered periventricular and subcortical foci of non-specific gliosis, likely chronic small vessel ischemic disease. 2. No acute findings.

## 2016-05-16 NOTE — Progress Notes (Signed)
[facility]  PATIENT: [name] DOB: 1955/06/23   REASON FOR VISIT: Follow-up for headaches HISTORY FROM: Patient alone at visit    [place] 02/27/2018CM [name], 61 year old female returns for follow-up for headache which was present for 3 weeks  when last seen by Dr. Brett Fairy. She has changed her diet, is exercising more. She has lost about 7 pounds.Blood pressure reading in the office today is 125/70. She claims her headaches are better controled at present. She denies any visual changes any difficulty sleeping, waking up with headaches. Her headaches were likely attributed to hypertension along with a sinusitis infection or allergic rhinitis with congestion. Nasacort seemed to help her congestion. She continues to eat pork was advised against that. She returns for reevaluation. She has questions about her MRI of the brain   HISTORY 04/12/16 [name] is a 61 y.o. female , seen here as a referral from Dr. Shelia Media, @ [facility],  Dr. Shelia Media reports that [name] has had severe headaches ever since late November, when she suffered from very high blood pressures the night after eating a salty ham. She called her doctor who recommended to take an extra blood pressure pill. That night her blood pressure decreased but she still felt somewhat ill and had a head pain or headache that lasted from there weeks. She took an extra dose of valsartan-hydrochlorothiazide. She also is on vitamin D, multivitamins, calcium, Dexalone, Valtrex, Zetia, and amlodipine. She did not report visual changes, no visual aura, but she did have some photophobia. She does not report phonophobia, she was not sleep deprived at the time of onset of headaches and the headaches did not keep her from sleeping. She described a throbbing pain in her forehead, not retro-orbital , not temporal. The headaches radiated to the top of her head. No  allodynia or hyperesthesia.  Just last Sunday she had again experienced headaches with them the for head, not lateralized, not associated with nausea or vomiting and she felt very congested. She reports feeling clogged up.  REVIEW OF SYSTEMS: Full 14 system review of systems performed and notable only for those listed, all others are neg:  Constitutional: neg  Cardiovascular: neg Ear/Nose/Throat: neg  Skin: neg Eyes: neg Respiratory: neg Gastroitestinal: neg  Hematology/Lymphatic: neg  Endocrine: neg Musculoskeletal:neg Allergy/Immunology: neg Neurological: neg Psychiatric: neg Sleep : neg   ALLERGIES: Allergies  Allergen Reactions  . Carafate [Sucralfate]   . Crestor [Rosuvastatin Calcium]   . Lipitor [Atorvastatin]   . Lovastatin   . Macrobid [Nitrofurantoin Macrocrystal]   . Sulfa Antibiotics   . Zocor [Simvastatin]     HOME MEDICATIONS: Outpatient Medications Prior to Visit  Medication Sig Dispense Refill  . amLODipine (NORVASC) 10 MG tablet Take 10 mg by mouth daily.    . calcium carbonate (CALCIUM 600) 600 MG TABS tablet Take 600 mg by mouth 2 (two) times daily with a meal.    . dexlansoprazole (DEXILANT) 60 MG capsule Take 60 mg by mouth daily.    Marland Kitchen ezetimibe (ZETIA) 10 MG tablet Take 10 mg by mouth daily.    . mometasone (NASONEX) 50 MCG/ACT nasal spray Place 2 sprays into the nose daily. 17 g 12  . Probiotic Product (PROBIOTIC-10) CAPS Take by mouth.    . sodium chloride (OCEAN) 0.65 % SOLN nasal spray Place 1 spray into both nostrils as needed for congestion. 1 Bottle 0  . valACYclovir (VALTREX) 1000 MG tablet Take 1,000 mg by mouth  daily as needed.     . valsartan-hydrochlorothiazide (DIOVAN-HCT) 160-12.5 MG tablet Take 1 tablet by mouth daily.    . Vitamin D, Cholecalciferol, 400 units CAPS Take 400 Units by mouth 2 (two) times daily.     No facility-administered medications prior to visit.     PAST MEDICAL HISTORY: Past Medical History:  Diagnosis Date    . AC (acromioclavicular) joint bone spurs, unspecified laterality   . Arthritis   . Chronic low back pain   . GERD (gastroesophageal reflux disease)   . Hyperlipemia   . Hypertension   . Urine incontinence     PAST SURGICAL HISTORY: Past Surgical History:  Procedure Laterality Date  . ABDOMINAL HYSTERECTOMY  2003  . CARPAL TUNNEL RELEASE    . HERNIA REPAIR    . SHOULDER SURGERY      FAMILY HISTORY: Family History  Problem Relation Age of Onset  . Diabetes Sister   . Hypertension Brother   . Alzheimer's disease Mother   . Prostate cancer Father   . Colon cancer Father     SOCIAL HISTORY: Social History   Social History  . Marital status: Widowed    Spouse name: N/A  . Number of children: N/A  . Years of education: N/A   Occupational History  . Not on file.   Social History Main Topics  . Smoking status: Never Smoker  . Smokeless tobacco: Never Used  . Alcohol use 0.6 oz/week    1 Standard drinks or equivalent per week  . Drug use: No  . Sexual activity: Not Currently   Other Topics Concern  . Not on file   Social History Narrative  . No narrative on file     PHYSICAL EXAM  Vitals:   05/16/16 0900  BP: 125/73  Pulse: (!) 56  Weight: 176 lb 3.2 oz (79.9 kg)  Height: 5\' 6"  (1.676 m)   Body mass index is 28.44 kg/m.  Generalized: Well developed, in no acute distress ,Well-groomed Head: normocephalic and atraumatic,. Oropharynx benign  Neck: Supple, no carotid bruits  Cardiac: Regular rate rhythm, no murmur  Musculoskeletal: No deformity   Neurological examination   Mentation: Alert oriented to time, place, history taking. Attention span and concentration appropriate. Recent and remote memory intact.  Follows all commands speech and language fluent.   Cranial nerve II-XII: Fundoscopic exam reveals sharp disc margins.Pupils were equal round reactive to light extraocular movements were full, visual field were full on confrontational test. Facial  sensation and strength were normal. hearing was intact to finger rubbing bilaterally. Uvula tongue midline. head turning and shoulder shrug were normal and symmetric.Tongue protrusion into cheek strength was normal. Motor: normal bulk and tone, full strength in the BUE, BLE, fine finger movements normal, no pronator drift. No focal weakness Sensory: normal and symmetric to light touch, pinprick, and  Vibration, in the upper and lower extremities Coordination: finger-nose-finger, heel-to-shin bilaterally, no dysmetria, no tremor Reflexes: Brachioradialis 2/2, biceps 2/2, triceps 2/2, patellar 2/2, Achilles 2/2, plantar responses were flexor bilaterally. Gait and Station: Rising up from seated position without assistance, normal stance,  moderate stride, good arm swing, smooth turning, able to perform tiptoe, and heel walking without difficulty. Tandem gait is steady  DIAGNOSTIC DATA (LABS, IMAGING, TESTING) - I reviewed patient records, labs, notes, testing and imaging myself where available.  MRI of the brain 2/22/18FINDINGS:  No abnormal lesions are seen on diffusion-weighted views to suggest acute ischemia. The cortical sulci, fissures and cisterns are normal in size  and appearance. Lateral, third and fourth ventricle are normal in size and appearance. No extra-axial fluid collections are seen. No evidence of mass effect or midline shift.    Mild scattered periventricular and subcortical foci of non-specific gliosis.  No abnormal lesions are seen on post contrast views.  On sagittal views the posterior fossa, pituitary gland and corpus callosum are unremarkable. No evidence of intracranial hemorrhage on gradient-echo views. The orbits and their contents, paranasal sinuses and calvarium are unremarkable.  Intracranial flow voids are present. IMPRESSION:  Mildly abnormal MRI brain (with and without) demonstrating: 1. Mild scattered periventricular and subcortical foci of non-specific gliosis,  likely chronic small vessel ischemic disease. 2. No acute findings. ASSESSMENT AND PLAN  61 y.o. year old female  has a past medical history of severe headaches,  Hyperlipemia; Hypertension; . here to follow-up. PLAN: Blood pressure remains well controlled today at 125/73, continue hypertensive medications she is to continue her DASH diet She was asked to avoid pork  products altogether She was asked to contact us for severe headaches She was congratulated on her weight loss and encouraged to continue her exercise program Reviewed MRI of the brain results with her and answered additional questions, patient was also given a written copy I spent 25 min  in total face to face time with the patient more than 50% of which was spent counseling and coordination of care, reviewing test results reviewing medications and discussing and reviewing the diagnosis of headache and hypertension hyperlipidemia as it relates to SVD  In addition she is asked to continue the normal saline for  allergic rhinitis as necessary.  Dennie Bible, [facility], [facility], APRN  [facility] [address] [place], [place] [address] [phone]

## 2017-03-03 ENCOUNTER — Other Ambulatory Visit: Payer: Self-pay | Admitting: Gynecology

## 2017-03-03 DIAGNOSIS — N644 Mastodynia: Secondary | ICD-10-CM

## 2017-03-08 ENCOUNTER — Other Ambulatory Visit: Payer: BLUE CROSS/BLUE SHIELD

## 2017-03-12 ENCOUNTER — Ambulatory Visit
Admission: RE | Admit: 2017-03-12 | Discharge: 2017-03-12 | Disposition: A | Discharge status: Home / Self Care | Payer: BLUE CROSS/BLUE SHIELD | Source: Ambulatory Visit | Attending: Gynecology | Admitting: Gynecology

## 2017-03-12 ENCOUNTER — Ambulatory Visit: Payer: BLUE CROSS/BLUE SHIELD

## 2017-03-12 DIAGNOSIS — N644 Mastodynia: Secondary | ICD-10-CM

## 2018-01-31 ENCOUNTER — Other Ambulatory Visit: Payer: Self-pay | Admitting: Obstetrics and Gynecology

## 2018-01-31 DIAGNOSIS — Z1231 Encounter for screening mammogram for malignant neoplasm of breast: Secondary | ICD-10-CM

## 2018-03-14 ENCOUNTER — Ambulatory Visit
Admission: RE | Admit: 2018-03-14 | Discharge: 2018-03-14 | Disposition: A | Discharge status: Home / Self Care | Payer: BLUE CROSS/BLUE SHIELD | Source: Ambulatory Visit | Attending: Obstetrics and Gynecology | Admitting: Obstetrics and Gynecology

## 2018-03-14 DIAGNOSIS — Z1231 Encounter for screening mammogram for malignant neoplasm of breast: Secondary | ICD-10-CM

## 2018-03-15 ENCOUNTER — Ambulatory Visit: Payer: BLUE CROSS/BLUE SHIELD

## 2018-10-21 DIAGNOSIS — I10 Essential (primary) hypertension: Secondary | ICD-10-CM | POA: Diagnosis not present

## 2018-10-21 DIAGNOSIS — R5383 Other fatigue: Secondary | ICD-10-CM | POA: Diagnosis not present

## 2018-10-21 DIAGNOSIS — R32 Unspecified urinary incontinence: Secondary | ICD-10-CM | POA: Diagnosis not present

## 2018-10-21 DIAGNOSIS — E785 Hyperlipidemia, unspecified: Secondary | ICD-10-CM | POA: Diagnosis not present

## 2018-10-24 DIAGNOSIS — Z7189 Other specified counseling: Secondary | ICD-10-CM | POA: Diagnosis not present

## 2018-10-24 DIAGNOSIS — D709 Neutropenia, unspecified: Secondary | ICD-10-CM | POA: Diagnosis not present

## 2018-10-24 DIAGNOSIS — E785 Hyperlipidemia, unspecified: Secondary | ICD-10-CM | POA: Diagnosis not present

## 2018-10-24 DIAGNOSIS — Z789 Other specified health status: Secondary | ICD-10-CM | POA: Diagnosis not present

## 2018-10-24 DIAGNOSIS — Z0000 Encounter for general adult medical examination without abnormal findings: Secondary | ICD-10-CM | POA: Diagnosis not present

## 2018-10-24 DIAGNOSIS — I10 Essential (primary) hypertension: Secondary | ICD-10-CM | POA: Diagnosis not present

## 2018-10-24 DIAGNOSIS — K219 Gastro-esophageal reflux disease without esophagitis: Secondary | ICD-10-CM | POA: Diagnosis not present

## 2018-10-24 DIAGNOSIS — Z780 Asymptomatic menopausal state: Secondary | ICD-10-CM | POA: Diagnosis not present

## 2018-11-13 DIAGNOSIS — E785 Hyperlipidemia, unspecified: Secondary | ICD-10-CM | POA: Diagnosis not present

## 2018-11-13 DIAGNOSIS — I10 Essential (primary) hypertension: Secondary | ICD-10-CM | POA: Diagnosis not present

## 2018-11-13 DIAGNOSIS — Z789 Other specified health status: Secondary | ICD-10-CM | POA: Diagnosis not present

## 2018-11-26 DIAGNOSIS — E785 Hyperlipidemia, unspecified: Secondary | ICD-10-CM | POA: Diagnosis not present

## 2018-11-26 DIAGNOSIS — Z789 Other specified health status: Secondary | ICD-10-CM | POA: Diagnosis not present

## 2018-11-26 MED FILL — REPATHA SURECLICK 140 MG/ML: 140 | 28 days supply | Qty: 2 | Fill #0

## 2018-11-29 DIAGNOSIS — Z23 Encounter for immunization: Secondary | ICD-10-CM | POA: Diagnosis not present

## 2018-12-24 MED FILL — REPATHA SURECLICK 140 MG/ML: 140 | 28 days supply | Qty: 2 | Fill #1

## 2018-12-26 ENCOUNTER — Other Ambulatory Visit: Payer: Self-pay

## 2018-12-26 DIAGNOSIS — Z20822 Contact with and (suspected) exposure to covid-19: Secondary | ICD-10-CM

## 2018-12-27 LAB — NOVEL CORONAVIRUS, NAA: SARS-CoV-2, NAA: NOT DETECTED

## 2019-01-01 DIAGNOSIS — Z800 Family history of malignant neoplasm of digestive organs: Secondary | ICD-10-CM | POA: Diagnosis not present

## 2019-01-01 DIAGNOSIS — D125 Benign neoplasm of sigmoid colon: Secondary | ICD-10-CM | POA: Diagnosis not present

## 2019-01-01 DIAGNOSIS — Z1211 Encounter for screening for malignant neoplasm of colon: Secondary | ICD-10-CM | POA: Diagnosis not present

## 2019-01-01 DIAGNOSIS — K635 Polyp of colon: Secondary | ICD-10-CM | POA: Diagnosis not present

## 2019-01-01 DIAGNOSIS — D123 Benign neoplasm of transverse colon: Secondary | ICD-10-CM | POA: Diagnosis not present

## 2019-01-20 MED FILL — REPATHA SURECLICK 140 MG/ML: 140 | 28 days supply | Qty: 2 | Fill #2

## 2019-01-29 ENCOUNTER — Other Ambulatory Visit: Payer: Self-pay | Admitting: Internal Medicine

## 2019-01-29 DIAGNOSIS — Z1231 Encounter for screening mammogram for malignant neoplasm of breast: Secondary | ICD-10-CM

## 2019-01-31 DIAGNOSIS — I10 Essential (primary) hypertension: Secondary | ICD-10-CM | POA: Diagnosis not present

## 2019-01-31 DIAGNOSIS — K219 Gastro-esophageal reflux disease without esophagitis: Secondary | ICD-10-CM | POA: Diagnosis not present

## 2019-02-17 DIAGNOSIS — I10 Essential (primary) hypertension: Secondary | ICD-10-CM | POA: Diagnosis not present

## 2019-02-17 DIAGNOSIS — N390 Urinary tract infection, site not specified: Secondary | ICD-10-CM | POA: Diagnosis not present

## 2019-02-17 MED FILL — REPATHA SURECLICK 140 MG/ML: 140 | 28 days supply | Qty: 2 | Fill #3

## 2019-02-24 DIAGNOSIS — R32 Unspecified urinary incontinence: Secondary | ICD-10-CM | POA: Diagnosis not present

## 2019-02-24 DIAGNOSIS — R350 Frequency of micturition: Secondary | ICD-10-CM | POA: Diagnosis not present

## 2019-02-24 DIAGNOSIS — M545 Low back pain: Secondary | ICD-10-CM | POA: Diagnosis not present

## 2019-03-07 DIAGNOSIS — R3915 Urgency of urination: Secondary | ICD-10-CM | POA: Diagnosis not present

## 2019-03-07 DIAGNOSIS — R350 Frequency of micturition: Secondary | ICD-10-CM | POA: Diagnosis not present

## 2019-03-17 MED FILL — REPATHA SURECLICK 140 MG/ML: 140 | 28 days supply | Qty: 2 | Fill #4

## 2019-03-26 DIAGNOSIS — I10 Essential (primary) hypertension: Secondary | ICD-10-CM | POA: Diagnosis not present

## 2019-03-26 DIAGNOSIS — Z789 Other specified health status: Secondary | ICD-10-CM | POA: Diagnosis not present

## 2019-03-26 DIAGNOSIS — E785 Hyperlipidemia, unspecified: Secondary | ICD-10-CM | POA: Diagnosis not present

## 2019-03-27 ENCOUNTER — Ambulatory Visit
Admission: RE | Admit: 2019-03-27 | Discharge: 2019-03-27 | Disposition: A | Discharge status: Home / Self Care | Payer: Medicare HMO | Source: Ambulatory Visit | Attending: Internal Medicine | Admitting: Internal Medicine

## 2019-03-27 ENCOUNTER — Other Ambulatory Visit: Payer: Self-pay

## 2019-03-27 DIAGNOSIS — Z1231 Encounter for screening mammogram for malignant neoplasm of breast: Secondary | ICD-10-CM

## 2019-04-15 MED FILL — REPATHA SURECLICK 140 MG/ML: 140 | 28 days supply | Qty: 2 | Fill #5

## 2019-04-18 DIAGNOSIS — I10 Essential (primary) hypertension: Secondary | ICD-10-CM | POA: Diagnosis not present

## 2019-04-18 DIAGNOSIS — R252 Cramp and spasm: Secondary | ICD-10-CM | POA: Diagnosis not present

## 2019-04-18 DIAGNOSIS — E785 Hyperlipidemia, unspecified: Secondary | ICD-10-CM | POA: Diagnosis not present

## 2019-04-18 DIAGNOSIS — D709 Neutropenia, unspecified: Secondary | ICD-10-CM | POA: Diagnosis not present

## 2019-04-18 DIAGNOSIS — Z79899 Other long term (current) drug therapy: Secondary | ICD-10-CM | POA: Diagnosis not present

## 2019-04-18 DIAGNOSIS — E559 Vitamin D deficiency, unspecified: Secondary | ICD-10-CM | POA: Diagnosis not present

## 2019-05-06 DIAGNOSIS — Z800 Family history of malignant neoplasm of digestive organs: Secondary | ICD-10-CM | POA: Diagnosis not present

## 2019-05-06 DIAGNOSIS — R1310 Dysphagia, unspecified: Secondary | ICD-10-CM | POA: Diagnosis not present

## 2019-05-06 DIAGNOSIS — K5730 Diverticulosis of large intestine without perforation or abscess without bleeding: Secondary | ICD-10-CM | POA: Diagnosis not present

## 2019-05-06 DIAGNOSIS — K219 Gastro-esophageal reflux disease without esophagitis: Secondary | ICD-10-CM | POA: Diagnosis not present

## 2019-05-06 DIAGNOSIS — Z86010 Personal history of colonic polyps: Secondary | ICD-10-CM | POA: Diagnosis not present

## 2019-05-09 DIAGNOSIS — E785 Hyperlipidemia, unspecified: Secondary | ICD-10-CM | POA: Diagnosis not present

## 2019-05-09 DIAGNOSIS — I10 Essential (primary) hypertension: Secondary | ICD-10-CM | POA: Diagnosis not present

## 2019-05-10 ENCOUNTER — Other Ambulatory Visit: Payer: Self-pay | Admitting: Gastroenterology

## 2019-05-10 DIAGNOSIS — R1310 Dysphagia, unspecified: Secondary | ICD-10-CM

## 2019-05-11 ENCOUNTER — Ambulatory Visit: Payer: Medicare HMO | Attending: Internal Medicine

## 2019-05-11 DIAGNOSIS — Z23 Encounter for immunization: Secondary | ICD-10-CM

## 2019-05-11 NOTE — Progress Notes (Signed)
   Covid-19 Vaccination Clinic  Name:  [name]    MRN: [id number] DOB: Sep 03, 1955  05/11/2019  [name] was observed post Covid-19 immunization for 15 minutes without incidence. She was provided with Vaccine Information Sheet and instruction to access the V-Safe system.   [name] was instructed to call 911 with any severe reactions post vaccine: Marland Kitchen Difficulty breathing  . Swelling of your face and throat  . A fast heartbeat  . A bad rash all over your body  . Dizziness and weakness    Immunizations Administered    Name Date Dose VIS Date Route   Pfizer COVID-19 Vaccine 05/11/2019 10:27 AM 0.3 mL 02/28/2019 Intramuscular   Manufacturer: [place]   Lot: [id number]   [place]: [id number]

## 2019-05-12 MED FILL — REPATHA SURECLICK 140 MG/ML: 140 | 28 days supply | Qty: 2 | Fill #6

## 2019-05-14 ENCOUNTER — Other Ambulatory Visit: Payer: Medicare HMO

## 2019-05-15 DIAGNOSIS — E785 Hyperlipidemia, unspecified: Secondary | ICD-10-CM | POA: Diagnosis not present

## 2019-05-15 DIAGNOSIS — I10 Essential (primary) hypertension: Secondary | ICD-10-CM | POA: Diagnosis not present

## 2019-05-15 DIAGNOSIS — K219 Gastro-esophageal reflux disease without esophagitis: Secondary | ICD-10-CM | POA: Diagnosis not present

## 2019-05-20 ENCOUNTER — Ambulatory Visit
Admission: RE | Admit: 2019-05-20 | Discharge: 2019-05-20 | Disposition: A | Discharge status: Home / Self Care | Payer: Medicare HMO | Source: Ambulatory Visit | Attending: Gastroenterology | Admitting: Gastroenterology

## 2019-05-20 DIAGNOSIS — K224 Dyskinesia of esophagus: Secondary | ICD-10-CM | POA: Diagnosis not present

## 2019-05-20 DIAGNOSIS — R1310 Dysphagia, unspecified: Secondary | ICD-10-CM

## 2019-05-30 DIAGNOSIS — R079 Chest pain, unspecified: Secondary | ICD-10-CM | POA: Diagnosis not present

## 2019-05-30 DIAGNOSIS — K219 Gastro-esophageal reflux disease without esophagitis: Secondary | ICD-10-CM | POA: Diagnosis not present

## 2019-05-30 DIAGNOSIS — I10 Essential (primary) hypertension: Secondary | ICD-10-CM | POA: Diagnosis not present

## 2019-06-04 ENCOUNTER — Ambulatory Visit: Payer: Medicare HMO | Attending: Internal Medicine

## 2019-06-04 DIAGNOSIS — Z23 Encounter for immunization: Secondary | ICD-10-CM

## 2019-06-04 NOTE — Progress Notes (Signed)
   Covid-19 Vaccination Clinic  Name:  SAMSARA TEDESCO    MRN: [id number] DOB: 09-17-1955  06/04/2019  Ms. Horstmeyer was observed post Covid-19 immunization for 15 minutes without incident. She was provided with Vaccine Information Sheet and instruction to access the V-Safe system.   Ms. Dalal was instructed to call 911 with any severe reactions post vaccine: Marland Kitchen Difficulty breathing  . Swelling of face and throat  . A fast heartbeat  . A bad rash all over body  . Dizziness and weakness   Immunizations Administered    Name Date Dose VIS Date Route   Pfizer COVID-19 Vaccine 06/04/2019 10:19 AM 0.3 mL 02/28/2019 Intramuscular   Manufacturer: [place]   Lot: [id number]   [place]: [id number]

## 2019-06-09 MED FILL — REPATHA SURECLICK 140 MG/ML: 140 | 28 days supply | Qty: 2 | Fill #7

## 2019-06-24 DIAGNOSIS — H04123 Dry eye syndrome of bilateral lacrimal glands: Secondary | ICD-10-CM | POA: Diagnosis not present

## 2019-06-24 DIAGNOSIS — H2513 Age-related nuclear cataract, bilateral: Secondary | ICD-10-CM | POA: Diagnosis not present

## 2019-06-24 DIAGNOSIS — H52203 Unspecified astigmatism, bilateral: Secondary | ICD-10-CM | POA: Diagnosis not present

## 2019-06-24 DIAGNOSIS — H5213 Myopia, bilateral: Secondary | ICD-10-CM | POA: Diagnosis not present

## 2019-07-07 MED FILL — REPATHA SURECLICK 140 MG/ML: 140 | 28 days supply | Qty: 2 | Fill #8

## 2019-07-28 DIAGNOSIS — H524 Presbyopia: Secondary | ICD-10-CM | POA: Diagnosis not present

## 2019-07-28 DIAGNOSIS — H52209 Unspecified astigmatism, unspecified eye: Secondary | ICD-10-CM | POA: Diagnosis not present

## 2019-07-28 DIAGNOSIS — H5213 Myopia, bilateral: Secondary | ICD-10-CM | POA: Diagnosis not present

## 2019-08-04 MED FILL — REPATHA SURECLICK 140 MG/ML: 140 | 28 days supply | Qty: 2 | Fill #9

## 2019-09-01 MED FILL — REPATHA SURECLICK 140 MG/ML: 140 | 28 days supply | Qty: 2 | Fill #10

## 2019-09-04 DIAGNOSIS — M545 Low back pain: Secondary | ICD-10-CM | POA: Diagnosis not present

## 2019-09-29 MED FILL — REPATHA SURECLICK 140 MG/ML: 140 | 28 days supply | Qty: 2 | Fill #11

## 2019-10-08 DIAGNOSIS — M545 Low back pain: Secondary | ICD-10-CM | POA: Diagnosis not present

## 2019-10-23 ENCOUNTER — Other Ambulatory Visit (HOSPITAL_COMMUNITY): Payer: Self-pay | Admitting: Internal Medicine

## 2019-10-28 MED FILL — REPATHA SURECLICK 140 MG/ML: 140 | 28 days supply | Qty: 2 | Fill #0

## 2019-11-06 DIAGNOSIS — E785 Hyperlipidemia, unspecified: Secondary | ICD-10-CM | POA: Diagnosis not present

## 2019-11-06 DIAGNOSIS — I10 Essential (primary) hypertension: Secondary | ICD-10-CM | POA: Diagnosis not present

## 2019-11-06 DIAGNOSIS — K219 Gastro-esophageal reflux disease without esophagitis: Secondary | ICD-10-CM | POA: Diagnosis not present

## 2019-11-13 DIAGNOSIS — I10 Essential (primary) hypertension: Secondary | ICD-10-CM | POA: Diagnosis not present

## 2019-11-13 DIAGNOSIS — D709 Neutropenia, unspecified: Secondary | ICD-10-CM | POA: Diagnosis not present

## 2019-11-13 DIAGNOSIS — Z789 Other specified health status: Secondary | ICD-10-CM | POA: Diagnosis not present

## 2019-11-13 DIAGNOSIS — E785 Hyperlipidemia, unspecified: Secondary | ICD-10-CM | POA: Diagnosis not present

## 2019-11-13 DIAGNOSIS — Z0000 Encounter for general adult medical examination without abnormal findings: Secondary | ICD-10-CM | POA: Diagnosis not present

## 2019-11-13 DIAGNOSIS — K219 Gastro-esophageal reflux disease without esophagitis: Secondary | ICD-10-CM | POA: Diagnosis not present

## 2019-11-13 DIAGNOSIS — Z23 Encounter for immunization: Secondary | ICD-10-CM | POA: Diagnosis not present

## 2019-11-19 DIAGNOSIS — H43811 Vitreous degeneration, right eye: Secondary | ICD-10-CM | POA: Diagnosis not present

## 2019-11-20 MED FILL — REPATHA SURECLICK 140 MG/ML: 140 | 28 days supply | Qty: 2 | Fill #1

## 2019-11-25 DIAGNOSIS — H43811 Vitreous degeneration, right eye: Secondary | ICD-10-CM | POA: Diagnosis not present

## 2019-11-26 DIAGNOSIS — K219 Gastro-esophageal reflux disease without esophagitis: Secondary | ICD-10-CM | POA: Diagnosis not present

## 2019-11-26 DIAGNOSIS — R002 Palpitations: Secondary | ICD-10-CM | POA: Diagnosis not present

## 2019-11-26 DIAGNOSIS — I10 Essential (primary) hypertension: Secondary | ICD-10-CM | POA: Diagnosis not present

## 2019-12-11 DIAGNOSIS — I10 Essential (primary) hypertension: Secondary | ICD-10-CM | POA: Diagnosis not present

## 2019-12-11 DIAGNOSIS — Z789 Other specified health status: Secondary | ICD-10-CM | POA: Diagnosis not present

## 2019-12-11 DIAGNOSIS — E785 Hyperlipidemia, unspecified: Secondary | ICD-10-CM | POA: Diagnosis not present

## 2019-12-11 DIAGNOSIS — R05 Cough: Secondary | ICD-10-CM | POA: Diagnosis not present

## 2019-12-19 DIAGNOSIS — Z789 Other specified health status: Secondary | ICD-10-CM | POA: Diagnosis not present

## 2019-12-19 DIAGNOSIS — K219 Gastro-esophageal reflux disease without esophagitis: Secondary | ICD-10-CM | POA: Diagnosis not present

## 2019-12-19 DIAGNOSIS — I10 Essential (primary) hypertension: Secondary | ICD-10-CM | POA: Diagnosis not present

## 2019-12-19 DIAGNOSIS — R002 Palpitations: Secondary | ICD-10-CM | POA: Diagnosis not present

## 2019-12-19 DIAGNOSIS — E785 Hyperlipidemia, unspecified: Secondary | ICD-10-CM | POA: Diagnosis not present

## 2019-12-22 MED FILL — REPATHA SURECLICK 140 MG/ML: 140 | 28 days supply | Qty: 2 | Fill #2

## 2020-01-13 DIAGNOSIS — R002 Palpitations: Secondary | ICD-10-CM | POA: Diagnosis not present

## 2020-01-13 DIAGNOSIS — I483 Typical atrial flutter: Secondary | ICD-10-CM | POA: Diagnosis not present

## 2020-01-19 DIAGNOSIS — I10 Essential (primary) hypertension: Secondary | ICD-10-CM | POA: Diagnosis not present

## 2020-01-19 DIAGNOSIS — E785 Hyperlipidemia, unspecified: Secondary | ICD-10-CM | POA: Diagnosis not present

## 2020-01-19 DIAGNOSIS — Z23 Encounter for immunization: Secondary | ICD-10-CM | POA: Diagnosis not present

## 2020-01-19 DIAGNOSIS — Z789 Other specified health status: Secondary | ICD-10-CM | POA: Diagnosis not present

## 2020-01-19 DIAGNOSIS — R002 Palpitations: Secondary | ICD-10-CM | POA: Diagnosis not present

## 2020-01-19 DIAGNOSIS — K219 Gastro-esophageal reflux disease without esophagitis: Secondary | ICD-10-CM | POA: Diagnosis not present

## 2020-01-20 DIAGNOSIS — N649 Disorder of breast, unspecified: Secondary | ICD-10-CM | POA: Diagnosis not present

## 2020-01-20 MED FILL — REPATHA SURECLICK 140 MG/ML: 140 | 28 days supply | Qty: 2 | Fill #3

## 2020-02-10 DIAGNOSIS — K219 Gastro-esophageal reflux disease without esophagitis: Secondary | ICD-10-CM | POA: Diagnosis not present

## 2020-02-10 DIAGNOSIS — K5730 Diverticulosis of large intestine without perforation or abscess without bleeding: Secondary | ICD-10-CM | POA: Diagnosis not present

## 2020-02-10 DIAGNOSIS — Z800 Family history of malignant neoplasm of digestive organs: Secondary | ICD-10-CM | POA: Diagnosis not present

## 2020-02-10 DIAGNOSIS — K449 Diaphragmatic hernia without obstruction or gangrene: Secondary | ICD-10-CM | POA: Diagnosis not present

## 2020-02-17 MED FILL — REPATHA SURECLICK 140 MG/ML: 140 | 28 days supply | Qty: 2 | Fill #4

## 2020-02-23 ENCOUNTER — Other Ambulatory Visit: Payer: Self-pay | Admitting: Obstetrics and Gynecology

## 2020-02-23 DIAGNOSIS — Z1231 Encounter for screening mammogram for malignant neoplasm of breast: Secondary | ICD-10-CM

## 2020-03-16 MED FILL — REPATHA SURECLICK 140 MG/ML: 140 | 28 days supply | Qty: 2 | Fill #5

## 2020-03-17 DIAGNOSIS — S161XXA Strain of muscle, fascia and tendon at neck level, initial encounter: Secondary | ICD-10-CM | POA: Diagnosis not present

## 2020-03-26 DIAGNOSIS — M79622 Pain in left upper arm: Secondary | ICD-10-CM | POA: Diagnosis not present

## 2020-03-26 DIAGNOSIS — R6884 Jaw pain: Secondary | ICD-10-CM | POA: Diagnosis not present

## 2020-03-26 DIAGNOSIS — M542 Cervicalgia: Secondary | ICD-10-CM | POA: Diagnosis not present

## 2020-03-26 DIAGNOSIS — M5030 Other cervical disc degeneration, unspecified cervical region: Secondary | ICD-10-CM | POA: Diagnosis not present

## 2020-04-07 ENCOUNTER — Ambulatory Visit
Admission: RE | Admit: 2020-04-07 | Discharge: 2020-04-07 | Disposition: A | Discharge status: Home / Self Care | Payer: Medicare HMO | Source: Ambulatory Visit | Attending: Obstetrics and Gynecology | Admitting: Obstetrics and Gynecology

## 2020-04-07 ENCOUNTER — Other Ambulatory Visit: Payer: Self-pay

## 2020-04-07 ENCOUNTER — Ambulatory Visit: Payer: Medicare HMO

## 2020-04-07 DIAGNOSIS — Z1231 Encounter for screening mammogram for malignant neoplasm of breast: Secondary | ICD-10-CM | POA: Diagnosis not present

## 2020-04-12 DIAGNOSIS — M25512 Pain in left shoulder: Secondary | ICD-10-CM | POA: Diagnosis not present

## 2020-04-12 DIAGNOSIS — M25712 Osteophyte, left shoulder: Secondary | ICD-10-CM | POA: Diagnosis not present

## 2020-04-12 MED FILL — REPATHA SURECLICK 140 MG/ML: 140 | 28 days supply | Qty: 2 | Fill #6

## 2020-04-16 DIAGNOSIS — H2513 Age-related nuclear cataract, bilateral: Secondary | ICD-10-CM | POA: Diagnosis not present

## 2020-04-16 DIAGNOSIS — H43811 Vitreous degeneration, right eye: Secondary | ICD-10-CM | POA: Diagnosis not present

## 2020-05-11 DIAGNOSIS — K219 Gastro-esophageal reflux disease without esophagitis: Secondary | ICD-10-CM | POA: Diagnosis not present

## 2020-05-11 DIAGNOSIS — Z800 Family history of malignant neoplasm of digestive organs: Secondary | ICD-10-CM | POA: Diagnosis not present

## 2020-05-11 DIAGNOSIS — Z86010 Personal history of colonic polyps: Secondary | ICD-10-CM | POA: Diagnosis not present

## 2020-05-11 DIAGNOSIS — K5732 Diverticulitis of large intestine without perforation or abscess without bleeding: Secondary | ICD-10-CM | POA: Diagnosis not present

## 2020-05-11 DIAGNOSIS — R1032 Left lower quadrant pain: Secondary | ICD-10-CM | POA: Diagnosis not present

## 2020-05-11 MED FILL — REPATHA SURECLICK 140 MG/ML: 140 | 28 days supply | Qty: 2 | Fill #7

## 2020-05-17 DIAGNOSIS — G729 Myopathy, unspecified: Secondary | ICD-10-CM | POA: Diagnosis not present

## 2020-05-17 DIAGNOSIS — M5030 Other cervical disc degeneration, unspecified cervical region: Secondary | ICD-10-CM | POA: Diagnosis not present

## 2020-05-17 DIAGNOSIS — Z789 Other specified health status: Secondary | ICD-10-CM | POA: Diagnosis not present

## 2020-05-17 DIAGNOSIS — E785 Hyperlipidemia, unspecified: Secondary | ICD-10-CM | POA: Diagnosis not present

## 2020-05-17 DIAGNOSIS — I10 Essential (primary) hypertension: Secondary | ICD-10-CM | POA: Diagnosis not present

## 2020-05-17 DIAGNOSIS — K219 Gastro-esophageal reflux disease without esophagitis: Secondary | ICD-10-CM | POA: Diagnosis not present

## 2020-05-20 ENCOUNTER — Ambulatory Visit (HOSPITAL_COMMUNITY)
Admission: RE | Admit: 2020-05-20 | Discharge: 2020-05-20 | Disposition: A | Discharge status: Home / Self Care | Payer: Medicare HMO | Source: Ambulatory Visit | Attending: Gastroenterology | Admitting: Gastroenterology

## 2020-05-20 ENCOUNTER — Other Ambulatory Visit (HOSPITAL_COMMUNITY): Payer: Self-pay | Admitting: Gastroenterology

## 2020-05-20 ENCOUNTER — Encounter (HOSPITAL_COMMUNITY): Payer: Self-pay

## 2020-05-20 ENCOUNTER — Other Ambulatory Visit: Payer: Self-pay | Admitting: Gastroenterology

## 2020-05-20 ENCOUNTER — Other Ambulatory Visit: Payer: Self-pay

## 2020-05-20 DIAGNOSIS — K429 Umbilical hernia without obstruction or gangrene: Secondary | ICD-10-CM | POA: Diagnosis not present

## 2020-05-20 DIAGNOSIS — R1032 Left lower quadrant pain: Secondary | ICD-10-CM | POA: Diagnosis not present

## 2020-05-20 DIAGNOSIS — R110 Nausea: Secondary | ICD-10-CM | POA: Diagnosis not present

## 2020-05-20 DIAGNOSIS — K5790 Diverticulosis of intestine, part unspecified, without perforation or abscess without bleeding: Secondary | ICD-10-CM | POA: Diagnosis not present

## 2020-05-20 DIAGNOSIS — N281 Cyst of kidney, acquired: Secondary | ICD-10-CM | POA: Diagnosis not present

## 2020-05-20 MED ORDER — IOHEXOL 300 MG/ML  SOLN
100.0000 mL | Freq: Once | INTRAMUSCULAR | Status: AC | PRN
  Administered 2020-05-20: 100 mL via INTRAVENOUS

## 2020-05-25 DIAGNOSIS — Z202 Contact with and (suspected) exposure to infections with a predominantly sexual mode of transmission: Secondary | ICD-10-CM | POA: Diagnosis not present

## 2020-05-25 DIAGNOSIS — Z113 Encounter for screening for infections with a predominantly sexual mode of transmission: Secondary | ICD-10-CM | POA: Diagnosis not present

## 2020-05-25 DIAGNOSIS — Z01419 Encounter for gynecological examination (general) (routine) without abnormal findings: Secondary | ICD-10-CM | POA: Diagnosis not present

## 2020-06-08 MED FILL — REPATHA SURECLICK 140 MG/ML: 140 | 28 days supply | Qty: 2 | Fill #8

## 2020-06-11 ENCOUNTER — Other Ambulatory Visit (HOSPITAL_COMMUNITY): Payer: Self-pay

## 2020-06-14 DIAGNOSIS — R350 Frequency of micturition: Secondary | ICD-10-CM | POA: Diagnosis not present

## 2020-06-29 DIAGNOSIS — H43811 Vitreous degeneration, right eye: Secondary | ICD-10-CM | POA: Diagnosis not present

## 2020-06-29 DIAGNOSIS — H40013 Open angle with borderline findings, low risk, bilateral: Secondary | ICD-10-CM | POA: Diagnosis not present

## 2020-06-29 DIAGNOSIS — H524 Presbyopia: Secondary | ICD-10-CM | POA: Diagnosis not present

## 2020-06-29 DIAGNOSIS — H2513 Age-related nuclear cataract, bilateral: Secondary | ICD-10-CM | POA: Diagnosis not present

## 2020-07-01 ENCOUNTER — Other Ambulatory Visit (HOSPITAL_COMMUNITY): Payer: Self-pay

## 2020-07-01 MED FILL — Evolocumab Subcutaneous Soln Auto-Injector 140 MG/ML: SUBCUTANEOUS | 28 days supply | Qty: 2 | Fill #0 | Status: AC

## 2020-07-05 ENCOUNTER — Other Ambulatory Visit (HOSPITAL_COMMUNITY): Payer: Self-pay

## 2020-07-17 DIAGNOSIS — I10 Essential (primary) hypertension: Secondary | ICD-10-CM | POA: Diagnosis not present

## 2020-07-17 DIAGNOSIS — M5030 Other cervical disc degeneration, unspecified cervical region: Secondary | ICD-10-CM | POA: Diagnosis not present

## 2020-07-17 DIAGNOSIS — K219 Gastro-esophageal reflux disease without esophagitis: Secondary | ICD-10-CM | POA: Diagnosis not present

## 2020-07-17 DIAGNOSIS — E785 Hyperlipidemia, unspecified: Secondary | ICD-10-CM | POA: Diagnosis not present

## 2020-07-21 DIAGNOSIS — M19012 Primary osteoarthritis, left shoulder: Secondary | ICD-10-CM | POA: Diagnosis not present

## 2020-07-26 ENCOUNTER — Other Ambulatory Visit: Payer: Self-pay | Admitting: Orthopedic Surgery

## 2020-07-26 DIAGNOSIS — G8929 Other chronic pain: Secondary | ICD-10-CM

## 2020-07-29 ENCOUNTER — Other Ambulatory Visit (HOSPITAL_COMMUNITY): Payer: Self-pay

## 2020-07-29 MED FILL — Evolocumab Subcutaneous Soln Auto-Injector 140 MG/ML: SUBCUTANEOUS | 28 days supply | Qty: 2 | Fill #1 | Status: AC

## 2020-07-31 ENCOUNTER — Other Ambulatory Visit (HOSPITAL_COMMUNITY): Payer: Self-pay

## 2020-08-02 ENCOUNTER — Other Ambulatory Visit (HOSPITAL_COMMUNITY): Payer: Self-pay

## 2020-08-08 ENCOUNTER — Other Ambulatory Visit: Payer: Self-pay

## 2020-08-08 ENCOUNTER — Ambulatory Visit
Admission: RE | Admit: 2020-08-08 | Discharge: 2020-08-08 | Disposition: A | Discharge status: Home / Self Care | Payer: Medicare HMO | Source: Ambulatory Visit | Attending: Orthopedic Surgery | Admitting: Orthopedic Surgery

## 2020-08-08 DIAGNOSIS — M25412 Effusion, left shoulder: Secondary | ICD-10-CM | POA: Diagnosis not present

## 2020-08-08 DIAGNOSIS — M25512 Pain in left shoulder: Secondary | ICD-10-CM

## 2020-08-08 DIAGNOSIS — G8929 Other chronic pain: Secondary | ICD-10-CM

## 2020-08-11 ENCOUNTER — Other Ambulatory Visit: Payer: Medicare HMO

## 2020-08-15 ENCOUNTER — Other Ambulatory Visit: Payer: Medicare HMO

## 2020-08-17 DIAGNOSIS — M7542 Impingement syndrome of left shoulder: Secondary | ICD-10-CM | POA: Diagnosis not present

## 2020-08-17 DIAGNOSIS — E785 Hyperlipidemia, unspecified: Secondary | ICD-10-CM | POA: Diagnosis not present

## 2020-08-17 DIAGNOSIS — M19012 Primary osteoarthritis, left shoulder: Secondary | ICD-10-CM | POA: Diagnosis not present

## 2020-08-17 DIAGNOSIS — K219 Gastro-esophageal reflux disease without esophagitis: Secondary | ICD-10-CM | POA: Diagnosis not present

## 2020-08-17 DIAGNOSIS — M5030 Other cervical disc degeneration, unspecified cervical region: Secondary | ICD-10-CM | POA: Diagnosis not present

## 2020-08-17 DIAGNOSIS — I10 Essential (primary) hypertension: Secondary | ICD-10-CM | POA: Diagnosis not present

## 2020-08-24 ENCOUNTER — Other Ambulatory Visit (HOSPITAL_COMMUNITY): Payer: Self-pay

## 2020-08-24 DIAGNOSIS — G8929 Other chronic pain: Secondary | ICD-10-CM | POA: Diagnosis not present

## 2020-08-24 DIAGNOSIS — M7542 Impingement syndrome of left shoulder: Secondary | ICD-10-CM | POA: Diagnosis not present

## 2020-08-24 DIAGNOSIS — M25512 Pain in left shoulder: Secondary | ICD-10-CM | POA: Diagnosis not present

## 2020-08-25 ENCOUNTER — Other Ambulatory Visit (HOSPITAL_COMMUNITY): Payer: Self-pay

## 2020-08-25 MED FILL — Evolocumab Subcutaneous Soln Auto-Injector 140 MG/ML: SUBCUTANEOUS | 28 days supply | Qty: 2 | Fill #2 | Status: AC

## 2020-08-30 ENCOUNTER — Other Ambulatory Visit (HOSPITAL_COMMUNITY): Payer: Self-pay

## 2020-09-16 DIAGNOSIS — E785 Hyperlipidemia, unspecified: Secondary | ICD-10-CM | POA: Diagnosis not present

## 2020-09-16 DIAGNOSIS — M5030 Other cervical disc degeneration, unspecified cervical region: Secondary | ICD-10-CM | POA: Diagnosis not present

## 2020-09-16 DIAGNOSIS — K219 Gastro-esophageal reflux disease without esophagitis: Secondary | ICD-10-CM | POA: Diagnosis not present

## 2020-09-16 DIAGNOSIS — I10 Essential (primary) hypertension: Secondary | ICD-10-CM | POA: Diagnosis not present

## 2020-09-17 ENCOUNTER — Other Ambulatory Visit (HOSPITAL_COMMUNITY): Payer: Self-pay

## 2020-09-21 ENCOUNTER — Other Ambulatory Visit (HOSPITAL_COMMUNITY): Payer: Self-pay

## 2020-09-21 MED ORDER — REPATHA SURECLICK 140 MG/ML ~~LOC~~ SOAJ
SUBCUTANEOUS | 6 refills | Status: DC
  Filled 2020-09-21: qty 2, 30d supply, fill #0
  Filled 2020-10-20: qty 2, 30d supply, fill #1
  Filled 2020-11-17: qty 2, 30d supply, fill #2
  Filled 2020-12-16: qty 2, 30d supply, fill #3
  Filled 2021-01-14: qty 2, 30d supply, fill #4
  Filled 2021-02-08: qty 2, 30d supply, fill #5
  Filled 2021-03-10: qty 2, 30d supply, fill #6

## 2020-09-24 ENCOUNTER — Other Ambulatory Visit (HOSPITAL_COMMUNITY): Payer: Self-pay

## 2020-10-17 DIAGNOSIS — E785 Hyperlipidemia, unspecified: Secondary | ICD-10-CM | POA: Diagnosis not present

## 2020-10-17 DIAGNOSIS — K219 Gastro-esophageal reflux disease without esophagitis: Secondary | ICD-10-CM | POA: Diagnosis not present

## 2020-10-17 DIAGNOSIS — I10 Essential (primary) hypertension: Secondary | ICD-10-CM | POA: Diagnosis not present

## 2020-10-20 ENCOUNTER — Other Ambulatory Visit (HOSPITAL_COMMUNITY): Payer: Self-pay

## 2020-10-26 ENCOUNTER — Other Ambulatory Visit (HOSPITAL_COMMUNITY): Payer: Self-pay

## 2020-10-28 DIAGNOSIS — J0100 Acute maxillary sinusitis, unspecified: Secondary | ICD-10-CM | POA: Diagnosis not present

## 2020-11-09 DIAGNOSIS — D709 Neutropenia, unspecified: Secondary | ICD-10-CM | POA: Diagnosis not present

## 2020-11-09 DIAGNOSIS — Z789 Other specified health status: Secondary | ICD-10-CM | POA: Diagnosis not present

## 2020-11-09 DIAGNOSIS — I10 Essential (primary) hypertension: Secondary | ICD-10-CM | POA: Diagnosis not present

## 2020-11-09 DIAGNOSIS — K219 Gastro-esophageal reflux disease without esophagitis: Secondary | ICD-10-CM | POA: Diagnosis not present

## 2020-11-09 DIAGNOSIS — Z0000 Encounter for general adult medical examination without abnormal findings: Secondary | ICD-10-CM | POA: Diagnosis not present

## 2020-11-09 DIAGNOSIS — E785 Hyperlipidemia, unspecified: Secondary | ICD-10-CM | POA: Diagnosis not present

## 2020-11-16 DIAGNOSIS — Z23 Encounter for immunization: Secondary | ICD-10-CM | POA: Diagnosis not present

## 2020-11-16 DIAGNOSIS — K219 Gastro-esophageal reflux disease without esophagitis: Secondary | ICD-10-CM | POA: Diagnosis not present

## 2020-11-16 DIAGNOSIS — Z0000 Encounter for general adult medical examination without abnormal findings: Secondary | ICD-10-CM | POA: Diagnosis not present

## 2020-11-16 DIAGNOSIS — I10 Essential (primary) hypertension: Secondary | ICD-10-CM | POA: Diagnosis not present

## 2020-11-16 DIAGNOSIS — E785 Hyperlipidemia, unspecified: Secondary | ICD-10-CM | POA: Diagnosis not present

## 2020-11-16 DIAGNOSIS — D709 Neutropenia, unspecified: Secondary | ICD-10-CM | POA: Diagnosis not present

## 2020-11-16 DIAGNOSIS — I700 Atherosclerosis of aorta: Secondary | ICD-10-CM | POA: Diagnosis not present

## 2020-11-17 ENCOUNTER — Other Ambulatory Visit (HOSPITAL_COMMUNITY): Payer: Self-pay

## 2020-11-18 ENCOUNTER — Other Ambulatory Visit (HOSPITAL_COMMUNITY): Payer: Self-pay

## 2020-11-30 DIAGNOSIS — R519 Headache, unspecified: Secondary | ICD-10-CM | POA: Diagnosis not present

## 2020-12-16 ENCOUNTER — Other Ambulatory Visit (HOSPITAL_COMMUNITY): Payer: Self-pay

## 2020-12-21 ENCOUNTER — Other Ambulatory Visit (HOSPITAL_COMMUNITY): Payer: Self-pay

## 2020-12-27 ENCOUNTER — Emergency Department (HOSPITAL_COMMUNITY): Payer: Medicare HMO

## 2020-12-27 ENCOUNTER — Emergency Department (HOSPITAL_COMMUNITY)
Admission: EM | Admit: 2020-12-27 | Discharge: 2020-12-28 | Disposition: A | Discharge status: Home / Self Care | Payer: Medicare HMO | Attending: Emergency Medicine | Admitting: Emergency Medicine

## 2020-12-27 ENCOUNTER — Encounter (HOSPITAL_COMMUNITY): Payer: Self-pay

## 2020-12-27 ENCOUNTER — Other Ambulatory Visit: Payer: Self-pay

## 2020-12-27 DIAGNOSIS — R001 Bradycardia, unspecified: Secondary | ICD-10-CM | POA: Diagnosis not present

## 2020-12-27 DIAGNOSIS — Z5321 Procedure and treatment not carried out due to patient leaving prior to being seen by health care provider: Secondary | ICD-10-CM | POA: Insufficient documentation

## 2020-12-27 DIAGNOSIS — R079 Chest pain, unspecified: Secondary | ICD-10-CM | POA: Insufficient documentation

## 2020-12-27 DIAGNOSIS — R0789 Other chest pain: Secondary | ICD-10-CM | POA: Diagnosis not present

## 2020-12-27 DIAGNOSIS — M25512 Pain in left shoulder: Secondary | ICD-10-CM | POA: Insufficient documentation

## 2020-12-27 LAB — CBC WITH DIFFERENTIAL/PLATELET
Abs Immature Granulocytes: 0.02 10*3/uL (ref 0.00–0.07)
Basophils Absolute: 0.1 10*3/uL (ref 0.0–0.1)
Basophils Relative: 1 %
Eosinophils Absolute: 0.1 10*3/uL (ref 0.0–0.5)
Eosinophils Relative: 2 %
HCT: 39.9 % (ref 36.0–46.0)
Hemoglobin: 13 g/dL (ref 12.0–15.0)
Immature Granulocytes: 0 %
Lymphocytes Relative: 34 %
Lymphs Abs: 2.2 10*3/uL (ref 0.7–4.0)
MCH: 28.7 pg (ref 26.0–34.0)
MCHC: 32.6 g/dL (ref 30.0–36.0)
MCV: 88.1 fL (ref 80.0–100.0)
Monocytes Absolute: 0.4 10*3/uL (ref 0.1–1.0)
Monocytes Relative: 6 %
Neutro Abs: 3.7 10*3/uL (ref 1.7–7.7)
Neutrophils Relative %: 57 %
Platelets: 296 10*3/uL (ref 150–400)
RBC: 4.53 MIL/uL (ref 3.87–5.11)
RDW: 13.3 % (ref 11.5–15.5)
WBC: 6.5 10*3/uL (ref 4.0–10.5)
nRBC: 0 % (ref 0.0–0.2)

## 2020-12-27 LAB — COMPREHENSIVE METABOLIC PANEL
ALT: 17 U/L (ref 0–44)
AST: 20 U/L (ref 15–41)
Albumin: 3.9 g/dL (ref 3.5–5.0)
Alkaline Phosphatase: 68 U/L (ref 38–126)
Anion gap: 8 (ref 5–15)
BUN: 10 mg/dL (ref 8–23)
CO2: 27 mmol/L (ref 22–32)
Calcium: 10 mg/dL (ref 8.9–10.3)
Chloride: 105 mmol/L (ref 98–111)
Creatinine, Ser: 0.68 mg/dL (ref 0.44–1.00)
GFR, Estimated: >60 mL/min (ref >60)
Glucose, Bld: 100 mg/dL — ABNORMAL HIGH (ref 70–99)
Potassium: 3.4 mmol/L — ABNORMAL LOW (ref 3.5–5.1)
Sodium: 140 mmol/L (ref 135–145)
Total Bilirubin: 0.6 mg/dL (ref 0.3–1.2)
Total Protein: 7.4 g/dL (ref 6.5–8.1)

## 2020-12-27 LAB — TROPONIN I (HIGH SENSITIVITY)
Troponin I (High Sensitivity): 4 ng/L (ref <18)
Troponin I (High Sensitivity): 4 ng/L (ref <18)

## 2020-12-27 LAB — LIPASE, BLOOD: Lipase: 22 U/L (ref 11–51)

## 2020-12-27 NOTE — ED Provider Notes (Signed)
Emergency Medicine Provider Triage Evaluation Note  [name] , a 65 y.o. female  was evaluated in triage.  Pt complains of intermittent CP 2 days of chest pain over the past 1 week. No other significant associated symptoms.  Does not seem to be aggravated or mitigated with exertion is nonpleuritic.  No hemoptysis.  Also complaining of some abdominal pain  Review of Systems  Positive: CP Negative: Fever, sob  Physical Exam  BP 126/75   Pulse 68   Temp 98.8 F (37.1 C) (Oral)   Resp 18   SpO2 100%  Gen:   Awake, no distress   Resp:  Normal effort  MSK:   Moves extremities without difficulty  Other:  No LEE, no calf TTP.  Abdomen soft nontender no guarding  Medical Decision Making  Medically screening exam initiated at 5:54 PM.  Appropriate orders placed.  [name] was informed that the remainder of the evaluation will be completed by another provider, this initial triage assessment does not replace that evaluation, and the importance of remaining in the ED until their evaluation is complete.  CP -intermittent over the past week. Abdominal pain that she states that occasionally occurs does not feel that she is having any pain currently.  Physical exam is unremarkable  Will obtain chest x-ray EKG and abd pain workup   Tedd Sias, [place] 12/27/20 1801    Sherwood Gambler, MD 12/31/20 1559

## 2020-12-27 NOTE — ED Notes (Signed)
Pt stated they were leaving  

## 2020-12-27 NOTE — ED Triage Notes (Signed)
Pt reports chest pain that started last week, intermittent. Pt c.o left shoulder pain at this time.

## 2021-01-03 DIAGNOSIS — R519 Headache, unspecified: Secondary | ICD-10-CM | POA: Diagnosis not present

## 2021-01-04 DIAGNOSIS — K219 Gastro-esophageal reflux disease without esophagitis: Secondary | ICD-10-CM | POA: Diagnosis not present

## 2021-01-04 DIAGNOSIS — M25512 Pain in left shoulder: Secondary | ICD-10-CM | POA: Diagnosis not present

## 2021-01-04 DIAGNOSIS — I10 Essential (primary) hypertension: Secondary | ICD-10-CM | POA: Diagnosis not present

## 2021-01-04 DIAGNOSIS — Z09 Encounter for follow-up examination after completed treatment for conditions other than malignant neoplasm: Secondary | ICD-10-CM | POA: Diagnosis not present

## 2021-01-04 DIAGNOSIS — Z23 Encounter for immunization: Secondary | ICD-10-CM | POA: Diagnosis not present

## 2021-01-12 ENCOUNTER — Other Ambulatory Visit (HOSPITAL_COMMUNITY): Payer: Self-pay

## 2021-01-13 DIAGNOSIS — R000 Tachycardia, unspecified: Secondary | ICD-10-CM | POA: Diagnosis not present

## 2021-01-13 DIAGNOSIS — J302 Other seasonal allergic rhinitis: Secondary | ICD-10-CM | POA: Diagnosis not present

## 2021-01-14 ENCOUNTER — Other Ambulatory Visit (HOSPITAL_COMMUNITY): Payer: Self-pay

## 2021-01-15 DIAGNOSIS — I10 Essential (primary) hypertension: Secondary | ICD-10-CM | POA: Diagnosis not present

## 2021-01-15 DIAGNOSIS — G4452 New daily persistent headache (NDPH): Secondary | ICD-10-CM | POA: Diagnosis not present

## 2021-01-18 ENCOUNTER — Other Ambulatory Visit (HOSPITAL_COMMUNITY): Payer: Self-pay

## 2021-02-02 DIAGNOSIS — I10 Essential (primary) hypertension: Secondary | ICD-10-CM | POA: Diagnosis not present

## 2021-02-08 ENCOUNTER — Other Ambulatory Visit (HOSPITAL_COMMUNITY): Payer: Self-pay

## 2021-02-15 ENCOUNTER — Other Ambulatory Visit (HOSPITAL_COMMUNITY): Payer: Self-pay

## 2021-02-17 DIAGNOSIS — M7541 Impingement syndrome of right shoulder: Secondary | ICD-10-CM | POA: Diagnosis not present

## 2021-03-01 DIAGNOSIS — H43811 Vitreous degeneration, right eye: Secondary | ICD-10-CM | POA: Diagnosis not present

## 2021-03-08 ENCOUNTER — Other Ambulatory Visit (HOSPITAL_COMMUNITY): Payer: Self-pay

## 2021-03-10 ENCOUNTER — Other Ambulatory Visit (HOSPITAL_COMMUNITY): Payer: Self-pay

## 2021-03-10 DIAGNOSIS — R051 Acute cough: Secondary | ICD-10-CM | POA: Diagnosis not present

## 2021-03-10 DIAGNOSIS — J301 Allergic rhinitis due to pollen: Secondary | ICD-10-CM | POA: Diagnosis not present

## 2021-03-15 ENCOUNTER — Other Ambulatory Visit (HOSPITAL_COMMUNITY): Payer: Self-pay

## 2021-03-17 ENCOUNTER — Other Ambulatory Visit: Payer: Self-pay | Admitting: Obstetrics and Gynecology

## 2021-03-17 DIAGNOSIS — Z1231 Encounter for screening mammogram for malignant neoplasm of breast: Secondary | ICD-10-CM

## 2021-03-18 DIAGNOSIS — I10 Essential (primary) hypertension: Secondary | ICD-10-CM | POA: Diagnosis not present

## 2021-03-18 DIAGNOSIS — K219 Gastro-esophageal reflux disease without esophagitis: Secondary | ICD-10-CM | POA: Diagnosis not present

## 2021-03-18 DIAGNOSIS — M5030 Other cervical disc degeneration, unspecified cervical region: Secondary | ICD-10-CM | POA: Diagnosis not present

## 2021-03-18 DIAGNOSIS — E785 Hyperlipidemia, unspecified: Secondary | ICD-10-CM | POA: Diagnosis not present

## 2021-04-08 ENCOUNTER — Other Ambulatory Visit (HOSPITAL_COMMUNITY): Payer: Self-pay

## 2021-04-08 MED ORDER — REPATHA SURECLICK 140 MG/ML ~~LOC~~ SOAJ
SUBCUTANEOUS | 6 refills | Status: DC
  Filled 2021-04-08: qty 2, 28d supply, fill #0
  Filled 2021-05-03: qty 2, 28d supply, fill #1
  Filled 2021-05-31: qty 2, 28d supply, fill #2
  Filled 2021-06-24: qty 2, 28d supply, fill #3
  Filled 2021-07-25: qty 2, 28d supply, fill #4
  Filled 2021-08-22: qty 2, 28d supply, fill #5
  Filled 2021-09-23: qty 2, 28d supply, fill #6

## 2021-04-11 ENCOUNTER — Other Ambulatory Visit (HOSPITAL_COMMUNITY): Payer: Self-pay

## 2021-04-12 ENCOUNTER — Ambulatory Visit
Admission: RE | Admit: 2021-04-12 | Discharge: 2021-04-12 | Disposition: A | Discharge status: Home / Self Care | Payer: Medicare HMO | Source: Ambulatory Visit | Attending: Obstetrics and Gynecology | Admitting: Obstetrics and Gynecology

## 2021-04-12 DIAGNOSIS — Z1231 Encounter for screening mammogram for malignant neoplasm of breast: Secondary | ICD-10-CM

## 2021-05-03 ENCOUNTER — Other Ambulatory Visit (HOSPITAL_COMMUNITY): Payer: Self-pay

## 2021-05-09 DIAGNOSIS — J300 Vasomotor rhinitis: Secondary | ICD-10-CM | POA: Diagnosis not present

## 2021-05-10 ENCOUNTER — Other Ambulatory Visit (HOSPITAL_COMMUNITY): Payer: Self-pay

## 2021-05-13 DIAGNOSIS — I10 Essential (primary) hypertension: Secondary | ICD-10-CM | POA: Diagnosis not present

## 2021-05-13 DIAGNOSIS — Z789 Other specified health status: Secondary | ICD-10-CM | POA: Diagnosis not present

## 2021-05-13 DIAGNOSIS — J309 Allergic rhinitis, unspecified: Secondary | ICD-10-CM | POA: Diagnosis not present

## 2021-05-13 DIAGNOSIS — E785 Hyperlipidemia, unspecified: Secondary | ICD-10-CM | POA: Diagnosis not present

## 2021-05-13 DIAGNOSIS — Z0000 Encounter for general adult medical examination without abnormal findings: Secondary | ICD-10-CM | POA: Diagnosis not present

## 2021-05-13 DIAGNOSIS — Z6828 Body mass index (BMI) 28.0-28.9, adult: Secondary | ICD-10-CM | POA: Diagnosis not present

## 2021-05-13 DIAGNOSIS — E663 Overweight: Secondary | ICD-10-CM | POA: Diagnosis not present

## 2021-05-19 DIAGNOSIS — E785 Hyperlipidemia, unspecified: Secondary | ICD-10-CM | POA: Diagnosis not present

## 2021-05-25 DIAGNOSIS — I10 Essential (primary) hypertension: Secondary | ICD-10-CM | POA: Diagnosis not present

## 2021-05-25 DIAGNOSIS — D709 Neutropenia, unspecified: Secondary | ICD-10-CM | POA: Diagnosis not present

## 2021-05-25 DIAGNOSIS — I700 Atherosclerosis of aorta: Secondary | ICD-10-CM | POA: Diagnosis not present

## 2021-05-25 DIAGNOSIS — E785 Hyperlipidemia, unspecified: Secondary | ICD-10-CM | POA: Diagnosis not present

## 2021-05-25 DIAGNOSIS — Z789 Other specified health status: Secondary | ICD-10-CM | POA: Diagnosis not present

## 2021-05-31 ENCOUNTER — Other Ambulatory Visit (HOSPITAL_COMMUNITY): Payer: Self-pay

## 2021-06-02 DIAGNOSIS — R5383 Other fatigue: Secondary | ICD-10-CM | POA: Diagnosis not present

## 2021-06-06 ENCOUNTER — Other Ambulatory Visit (HOSPITAL_COMMUNITY): Payer: Self-pay

## 2021-06-08 DIAGNOSIS — E0590 Thyrotoxicosis, unspecified without thyrotoxic crisis or storm: Secondary | ICD-10-CM | POA: Diagnosis not present

## 2021-06-08 DIAGNOSIS — I700 Atherosclerosis of aorta: Secondary | ICD-10-CM | POA: Diagnosis not present

## 2021-06-08 DIAGNOSIS — D709 Neutropenia, unspecified: Secondary | ICD-10-CM | POA: Diagnosis not present

## 2021-06-08 DIAGNOSIS — R5383 Other fatigue: Secondary | ICD-10-CM | POA: Diagnosis not present

## 2021-06-10 ENCOUNTER — Other Ambulatory Visit: Payer: Self-pay | Admitting: Internal Medicine

## 2021-06-10 DIAGNOSIS — E0590 Thyrotoxicosis, unspecified without thyrotoxic crisis or storm: Secondary | ICD-10-CM

## 2021-06-15 ENCOUNTER — Ambulatory Visit
Admission: RE | Admit: 2021-06-15 | Discharge: 2021-06-15 | Disposition: A | Discharge status: Home / Self Care | Payer: Medicare HMO | Source: Ambulatory Visit | Attending: Internal Medicine | Admitting: Internal Medicine

## 2021-06-15 DIAGNOSIS — E039 Hypothyroidism, unspecified: Secondary | ICD-10-CM | POA: Diagnosis not present

## 2021-06-15 DIAGNOSIS — E0590 Thyrotoxicosis, unspecified without thyrotoxic crisis or storm: Secondary | ICD-10-CM

## 2021-06-21 ENCOUNTER — Other Ambulatory Visit: Payer: Self-pay | Admitting: Internal Medicine

## 2021-06-21 ENCOUNTER — Other Ambulatory Visit (HOSPITAL_COMMUNITY): Payer: Self-pay | Admitting: Internal Medicine

## 2021-06-21 DIAGNOSIS — E0590 Thyrotoxicosis, unspecified without thyrotoxic crisis or storm: Secondary | ICD-10-CM

## 2021-06-21 DIAGNOSIS — R5383 Other fatigue: Secondary | ICD-10-CM

## 2021-06-23 ENCOUNTER — Other Ambulatory Visit (HOSPITAL_COMMUNITY): Payer: Self-pay

## 2021-06-24 ENCOUNTER — Other Ambulatory Visit (HOSPITAL_COMMUNITY): Payer: Self-pay

## 2021-06-30 DIAGNOSIS — K5900 Constipation, unspecified: Secondary | ICD-10-CM | POA: Diagnosis not present

## 2021-06-30 DIAGNOSIS — R140 Abdominal distension (gaseous): Secondary | ICD-10-CM | POA: Diagnosis not present

## 2021-06-30 DIAGNOSIS — Z86010 Personal history of colonic polyps: Secondary | ICD-10-CM | POA: Diagnosis not present

## 2021-06-30 DIAGNOSIS — K5730 Diverticulosis of large intestine without perforation or abscess without bleeding: Secondary | ICD-10-CM | POA: Diagnosis not present

## 2021-06-30 DIAGNOSIS — K219 Gastro-esophageal reflux disease without esophagitis: Secondary | ICD-10-CM | POA: Diagnosis not present

## 2021-07-04 ENCOUNTER — Other Ambulatory Visit (HOSPITAL_COMMUNITY): Payer: Self-pay

## 2021-07-05 DIAGNOSIS — H52203 Unspecified astigmatism, bilateral: Secondary | ICD-10-CM | POA: Diagnosis not present

## 2021-07-05 DIAGNOSIS — H25812 Combined forms of age-related cataract, left eye: Secondary | ICD-10-CM | POA: Diagnosis not present

## 2021-07-05 DIAGNOSIS — H31001 Unspecified chorioretinal scars, right eye: Secondary | ICD-10-CM | POA: Diagnosis not present

## 2021-07-05 DIAGNOSIS — H43811 Vitreous degeneration, right eye: Secondary | ICD-10-CM | POA: Diagnosis not present

## 2021-07-11 ENCOUNTER — Encounter (HOSPITAL_COMMUNITY)
Admission: RE | Admit: 2021-07-11 | Discharge: 2021-07-11 | Disposition: A | Discharge status: Home / Self Care | Payer: Medicare HMO | Source: Ambulatory Visit | Attending: Internal Medicine | Admitting: Internal Medicine

## 2021-07-11 DIAGNOSIS — R5383 Other fatigue: Secondary | ICD-10-CM | POA: Insufficient documentation

## 2021-07-11 DIAGNOSIS — E0590 Thyrotoxicosis, unspecified without thyrotoxic crisis or storm: Secondary | ICD-10-CM | POA: Diagnosis not present

## 2021-07-11 MED ORDER — SODIUM IODIDE I-123 7.4 MBQ CAPS
434.0000 | ORAL_CAPSULE | Freq: Once | ORAL | Status: AC
Start: 2021-07-11 — End: 2021-07-11
  Administered 2021-07-11: 434 via ORAL

## 2021-07-12 ENCOUNTER — Encounter (HOSPITAL_COMMUNITY)
Admission: RE | Admit: 2021-07-12 | Discharge: 2021-07-12 | Disposition: A | Discharge status: Home / Self Care | Payer: Medicare HMO | Source: Ambulatory Visit | Attending: Internal Medicine | Admitting: Internal Medicine

## 2021-07-14 DIAGNOSIS — E0590 Thyrotoxicosis, unspecified without thyrotoxic crisis or storm: Secondary | ICD-10-CM | POA: Diagnosis not present

## 2021-07-18 DIAGNOSIS — E0590 Thyrotoxicosis, unspecified without thyrotoxic crisis or storm: Secondary | ICD-10-CM | POA: Diagnosis not present

## 2021-07-21 DIAGNOSIS — E0590 Thyrotoxicosis, unspecified without thyrotoxic crisis or storm: Secondary | ICD-10-CM | POA: Diagnosis not present

## 2021-07-25 ENCOUNTER — Other Ambulatory Visit (HOSPITAL_COMMUNITY): Payer: Self-pay

## 2021-07-25 DIAGNOSIS — E0590 Thyrotoxicosis, unspecified without thyrotoxic crisis or storm: Secondary | ICD-10-CM | POA: Diagnosis not present

## 2021-07-25 DIAGNOSIS — I10 Essential (primary) hypertension: Secondary | ICD-10-CM | POA: Diagnosis not present

## 2021-07-26 ENCOUNTER — Other Ambulatory Visit (HOSPITAL_COMMUNITY): Payer: Self-pay | Admitting: Endocrinology

## 2021-07-26 DIAGNOSIS — E0590 Thyrotoxicosis, unspecified without thyrotoxic crisis or storm: Secondary | ICD-10-CM

## 2021-08-01 ENCOUNTER — Other Ambulatory Visit (HOSPITAL_COMMUNITY): Payer: Self-pay

## 2021-08-10 ENCOUNTER — Encounter (HOSPITAL_COMMUNITY): Payer: Self-pay

## 2021-08-10 ENCOUNTER — Encounter (HOSPITAL_COMMUNITY)
Admission: RE | Admit: 2021-08-10 | Discharge: 2021-08-10 | Disposition: A | Discharge status: Home / Self Care | Payer: Medicare HMO | Source: Ambulatory Visit | Attending: Endocrinology | Admitting: Endocrinology

## 2021-08-12 DIAGNOSIS — E0590 Thyrotoxicosis, unspecified without thyrotoxic crisis or storm: Secondary | ICD-10-CM | POA: Diagnosis not present

## 2021-08-16 DIAGNOSIS — H2512 Age-related nuclear cataract, left eye: Secondary | ICD-10-CM | POA: Diagnosis not present

## 2021-08-19 ENCOUNTER — Other Ambulatory Visit (HOSPITAL_COMMUNITY): Payer: Self-pay

## 2021-08-22 ENCOUNTER — Other Ambulatory Visit (HOSPITAL_COMMUNITY): Payer: Self-pay

## 2021-08-22 DIAGNOSIS — I10 Essential (primary) hypertension: Secondary | ICD-10-CM | POA: Diagnosis not present

## 2021-08-22 DIAGNOSIS — R21 Rash and other nonspecific skin eruption: Secondary | ICD-10-CM | POA: Diagnosis not present

## 2021-08-22 DIAGNOSIS — E0590 Thyrotoxicosis, unspecified without thyrotoxic crisis or storm: Secondary | ICD-10-CM | POA: Diagnosis not present

## 2021-08-22 DIAGNOSIS — E0520 Thyrotoxicosis with toxic multinodular goiter without thyrotoxic crisis or storm: Secondary | ICD-10-CM | POA: Diagnosis not present

## 2021-08-30 ENCOUNTER — Other Ambulatory Visit (HOSPITAL_COMMUNITY): Payer: Self-pay

## 2021-09-21 ENCOUNTER — Other Ambulatory Visit (HOSPITAL_COMMUNITY): Payer: Self-pay

## 2021-09-23 ENCOUNTER — Other Ambulatory Visit (HOSPITAL_COMMUNITY): Payer: Self-pay

## 2021-09-27 ENCOUNTER — Other Ambulatory Visit (HOSPITAL_COMMUNITY): Payer: Self-pay

## 2021-10-05 ENCOUNTER — Ambulatory Visit: Payer: Self-pay | Admitting: Surgery

## 2021-10-05 DIAGNOSIS — E042 Nontoxic multinodular goiter: Secondary | ICD-10-CM | POA: Diagnosis not present

## 2021-10-05 DIAGNOSIS — E0590 Thyrotoxicosis, unspecified without thyrotoxic crisis or storm: Secondary | ICD-10-CM | POA: Diagnosis not present

## 2021-10-14 NOTE — Patient Instructions (Signed)
SURGICAL WAITING ROOM VISITATION Patients having surgery or a procedure may have no more than 2 support people in the waiting area - these visitors may rotate.   Children under the age of 86 must have an adult with them who is not the patient. If the patient needs to stay at the hospital during part of their recovery, the visitor guidelines for inpatient rooms apply. Pre-op nurse will coordinate an appropriate time for 1 support person to accompany patient in pre-op.  This support person may not rotate.    Please refer to the [facility] website for the visitor guidelines for Inpatients (after your surgery is over and you are in a regular room).    Your procedure is scheduled on: 10/21/21   Report to [facility] Main Entrance    Report to admitting at 10:45 AM   Call this number if you have problems the morning of surgery [phone]   Do not eat food :After Midnight.   After Midnight you may have the following liquids until 10:00 AM DAY OF SURGERY  Water Non-Citrus Juices (without pulp, NO RED) Carbonated Beverages Black Coffee (NO MILK/CREAM OR CREAMERS, sugar ok)  Clear Tea (NO MILK/CREAM OR CREAMERS, sugar ok) regular and decaf                             Plain Jell-O (NO RED)                                           Fruit ices (not with fruit pulp, NO RED)                                     Popsicles (NO RED)                                                               Sports drinks like Gatorade (NO RED)  FOLLOW BOWEL PREP AND ANY ADDITIONAL PRE OP INSTRUCTIONS YOU RECEIVED FROM YOUR SURGEON'S OFFICE!!!     Oral Hygiene is also important to reduce your risk of infection.                                    Remember - BRUSH YOUR TEETH THE MORNING OF SURGERY WITH YOUR REGULAR TOOTHPASTE   Take these medicines the morning of surgery with A SIP OF WATER: Amlodipine, Pepcid, Xyzal, Metoprolol                               You may not have any metal on your body  including hair pins, jewelry, and body piercing             Do not wear make-up, lotions, powders, perfumes, or deodorant  Do not wear nail polish including gel and S&S, artificial/acrylic nails, or any other type of covering on natural nails including finger and toenails. If you have artificial nails, gel coating, etc. that  needs to be removed by a nail salon please have this removed prior to surgery or surgery may need to be canceled/ delayed if the surgeon/ anesthesia feels like they are unable to be safely monitored.   Do not shave  48 hours prior to surgery.    Do not bring valuables to the hospital. [place].   Contacts, dentures or bridgework may not be worn into surgery.   Bring small overnight bag day of surgery.   DO NOT [place]. PHARMACY WILL DISPENSE MEDICATIONS LISTED ON YOUR MEDICATION LIST TO YOU DURING YOUR ADMISSION [facility]!               Please read over the following fact sheets you were given: IF YOU HAVE QUESTIONS ABOUT YOUR PRE-OP INSTRUCTIONS PLEASE CALL [facility] - Preparing for Surgery Before surgery, you can play an important role.  Because skin is not sterile, your skin needs to be as free of germs as possible.  You can reduce the number of germs on your skin by washing with CHG (chlorahexidine gluconate) soap before surgery.  CHG is an antiseptic cleaner which kills germs and bonds with the skin to continue killing germs even after washing. Please DO NOT use if you have an allergy to CHG or antibacterial soaps.  If your skin becomes reddened/irritated stop using the CHG and inform your nurse when you arrive at Short Stay. Do not shave (including legs and underarms) for at least 48 hours prior to the first CHG shower.  You may shave your face/neck.  Please follow these instructions carefully:  1.  Shower with CHG Soap the night before surgery  and the  morning of surgery.  2.  If you choose to wash your hair, wash your hair first as usual with your normal  shampoo.  3.  After you shampoo, rinse your hair and body thoroughly to remove the shampoo.                             4.  Use CHG as you would any other liquid soap.  You can apply chg directly to the skin and wash.  Gently with a scrungie or clean washcloth.  5.  Apply the CHG Soap to your body ONLY FROM THE NECK DOWN.   Do   not use on face/ open                           Wound or open sores. Avoid contact with eyes, ears mouth and   genitals (private parts).                       Wash face,  Genitals (private parts) with your normal soap.             6.  Wash thoroughly, paying special attention to the area where your    surgery  will be performed.  7.  Thoroughly rinse your body with warm water from the neck down.  8.  DO NOT shower/wash with your normal soap after using and rinsing off the CHG Soap.                9.  Pat yourself dry  with a clean towel.            10.  Wear clean pajamas.            11.  Place clean sheets on your bed the night of your first shower and do not  sleep with pets. Day of Surgery : Do not apply any lotions/deodorants the morning of surgery.  Please wear clean clothes to the hospital/surgery center.  FAILURE TO FOLLOW THESE INSTRUCTIONS MAY RESULT IN THE CANCELLATION OF YOUR SURGERY  PATIENT SIGNATURE_________________________________  NURSE SIGNATURE__________________________________  ________________________________________________________________________

## 2021-10-14 NOTE — Progress Notes (Signed)
COVID Vaccine Completed: yesx2  Date of COVID positive in last 90 days:  PCP - Merrilee Seashore, MD Cardiologist -   Chest x-ray -  EKG - 12/28/20 Epic Stress Test -  ECHO -  Cardiac Cath -  Pacemaker/ICD device last checked: Spinal Cord Stimulator:  Bowel Prep -   Sleep Study -  CPAP -   Fasting Blood Sugar -  Checks Blood Sugar _____ times a day  Blood Thinner Instructions: Aspirin Instructions: Last Dose:  Activity level:  Can go up a flight of stairs and perform activities of daily living without stopping and without symptoms of chest pain or shortness of breath.  Able to exercise without symptoms  Unable to go up a flight of stairs without symptoms of     Anesthesia review:   Patient denies shortness of breath, fever, cough and chest pain at PAT appointment  Patient verbalized understanding of instructions that were given to them at the PAT appointment. Patient was also instructed that they will need to review over the PAT instructions again at home before surgery.

## 2021-10-16 ENCOUNTER — Encounter (HOSPITAL_COMMUNITY): Payer: Self-pay | Admitting: Surgery

## 2021-10-16 DIAGNOSIS — E0590 Thyrotoxicosis, unspecified without thyrotoxic crisis or storm: Secondary | ICD-10-CM | POA: Diagnosis present

## 2021-10-16 DIAGNOSIS — E042 Nontoxic multinodular goiter: Secondary | ICD-10-CM | POA: Diagnosis present

## 2021-10-16 NOTE — H&P (Signed)
REFERRING PHYSICIAN: Birdena Crandall, MD  PROVIDER: France Noyce Charlotta Newton, MD   Chief Complaint: New Consultation (Hyperthyroidism)  History of Present Illness:  Patient is referred by Dr. Jacelyn Pi for surgical evaluation and management of hyperthyroidism and multiple thyroid nodules. Patient was initially diagnosed in the spring 2023. TSH level was undetectable at less than 0.005. Patient was started on methimazole which she tolerated poorly due to itching. She was treated with 10 days of prednisone. She is on metoprolol for control of palpitations and tachycardia. Patient underwent an ultrasound examination on June 15, 2021. This showed a borderline enlarged thyroid gland with multiple small nodules. None of these met criteria for needle biopsy. Patient also underwent a nuclear medicine thyroid uptake scan on July 11, 2021. This demonstrated a heterogeneous appearing thyroid consistent with multinodular goiter corresponding to the ultrasound appearance. There was an elevated 24-hour iodine uptake. Patient has no prior history of thyroid disease. She has never been on thyroid medication. She has had no prior head or neck surgery. There is no family history of thyroid disease. Patient is retired from [employer] She is accompanied today by her daughter.  Review of Systems: A complete review of systems was obtained from the patient. I have reviewed this information and discussed as appropriate with the patient. See HPI as well for other ROS.  Review of Systems  Constitutional: Positive for malaise/fatigue.  HENT: Negative.  Eyes: Negative.  Respiratory: Negative.  Cardiovascular: Positive for palpitations.  Gastrointestinal: Negative.  Genitourinary: Negative.  Musculoskeletal: Negative.  Skin: Positive for itching.  Neurological: Negative.  Endo/Heme/Allergies: Negative.  Psychiatric/Behavioral: Negative.   Medical History: Past Medical History:  Diagnosis Date   Arthritis  GERD (gastroesophageal reflux disease)  Hyperlipidemia  Hypertension   Patient Active Problem List  Diagnosis  Hyperthyroidism  Multiple thyroid nodules   Past Surgical History:  Procedure Laterality Date  HYSTERECTOMY 2002  CORRECTION HAMMER TOE 2013    Allergies  Allergen Reactions  Atorvastatin Muscle Pain and Other (See Comments)  Azithromycin Vomiting, Nausea and Other (See Comments)  Clotrimazole Other (See Comments)  Esomeprazole Magnesium Headache  Estrogens Other (See Comments)  Hydrochlorothiazide Nausea  Lovastatin Muscle Pain and Other (See Comments)  Methimazole Rash  Metoprolol Other (See Comments)  Metronidazole Other (See Comments)  Nitrofurantoin Macrocrystal Other (See Comments)  Nitrofurantoin Monohyd/M-Cryst Other (See Comments)  Nystatin Nausea  Rosuvastatin Muscle Pain and Other (See Comments)  Simvastatin Muscle Pain and Other (See Comments)  Sucralfate Other (See Comments)  Sulfa (Sulfonamide Antibiotics) Other (See Comments)  Sulfamethoxazole Rash   Current Outpatient Medications on File Prior to Visit  Medication Sig Dispense Refill  famotidine (PEPCID) 40 MG tablet 1 tablet  losartan (COZAAR) 100 MG tablet Take 1 tablet by mouth once daily  metoprolol succinate (TOPROL-XL) 25 MG XL tablet Take 25 mg by mouth once daily  amLODIPine (NORVASC) 10 MG tablet Take 1 tablet by mouth once daily  calcium carbonate-vitamin D3 (CALCIUM 600 + D,3,) 600 mg-10 mcg (400 unit) tablet 1 tablet  cholecalciferol (VITAMIN D3) 400 unit tablet 1 capsule  Lactobacillus acidophilus (PROBIOTIC ORAL) 1 capsule  multivitamin tablet 1 tablet   No current facility-administered medications on file prior to visit.   History reviewed. No pertinent family history.   Social History   Tobacco Use  Smoking Status Never  Smokeless Tobacco Never    Social History   Socioeconomic History  Marital status: Widowed  Tobacco Use  Smoking status: Never   Smokeless  tobacco: Never  Substance and Sexual Activity  Alcohol use: Not Currently  Drug use: Never   Objective:   Vitals:  BP: (!) 140/82  Pulse: 88  Temp: 36.7 C (98.1 F)  SpO2: 98%  Weight: 78.8 kg (173 lb 12.8 oz)  Height: 168.9 cm (5' 6.5")   Body mass index is 27.63 kg/m.  Physical Exam   GENERAL APPEARANCE Comfortable, no acute issues Development: normal Gross deformities: none  SKIN Rash, lesions, ulcers: none Induration, erythema: none Nodules: none palpable  EYES Conjunctiva and lids: normal Pupils: equal and reactive  EARS, NOSE, MOUTH, THROAT External ears: no lesion or deformity External nose: no lesion or deformity Hearing: grossly normal  NECK Symmetric: yes Trachea: midline Thyroid: Thyroid gland is mildly enlarged and slightly firm to palpation, nodular without discrete or dominant mass. There is no tenderness. There is no associated lymphadenopathy.  CHEST Respiratory effort: normal Retraction or accessory muscle use: no Breath sounds: normal bilaterally Rales, rhonchi, wheeze: none  CARDIOVASCULAR Auscultation: regular rhythm, normal rate Murmurs: none Pulses: radial pulse 2+ palpable Lower extremity edema: none  ABDOMEN Not assessed  GENITOURINARY/RECTAL Not assessed  MUSCULOSKELETAL Station and gait: normal Digits and nails: no clubbing or cyanosis Muscle strength: grossly normal all extremities Range of motion: grossly normal all extremities Deformity: none  LYMPHATIC Cervical: none palpable Supraclavicular: none palpable  PSYCHIATRIC Oriented to person, place, and time: yes Mood and affect: normal for situation Judgment and insight: appropriate for situation    Assessment and Plan:   Hyperthyroidism  Multiple thyroid nodules  Patient presents today on referral from her endocrinologist, Dr. Jacelyn Pi, for surgical evaluation and management of hyperthyroidism with a multinodular thyroid  gland.  Patient provided with a copy of "The Thyroid Book: Medical and Surgical Treatment of Thyroid Problems", published by [employer], 16 pages. Book reviewed and explained to patient during visit today.  Patient has hyperthyroidism. Patient was unable to tolerate antithyroid medication. She is currently on beta-blockade. Today we discussed options for definitive management including radioactive iodine ablation versus surgical removal of the thyroid. We discussed the risk and benefits of thyroid surgery including the risk of recurrent laryngeal nerve injury and injury to parathyroid glands. We discussed the size and location of the surgical incision. We discussed the hospital stay to be anticipated. We discussed her postoperative recovery and return to activities. We discussed the need for lifelong thyroid hormone replacement. The patient understands and wishes to proceed with surgery in the near future.   Armandina Gemma, MD [facility] [place] practice Office: [phone]

## 2021-10-17 DIAGNOSIS — E0520 Thyrotoxicosis with toxic multinodular goiter without thyrotoxic crisis or storm: Secondary | ICD-10-CM | POA: Diagnosis not present

## 2021-10-17 DIAGNOSIS — E0590 Thyrotoxicosis, unspecified without thyrotoxic crisis or storm: Secondary | ICD-10-CM | POA: Diagnosis not present

## 2021-10-17 DIAGNOSIS — I10 Essential (primary) hypertension: Secondary | ICD-10-CM | POA: Diagnosis not present

## 2021-10-18 ENCOUNTER — Ambulatory Visit (HOSPITAL_COMMUNITY)
Admission: RE | Admit: 2021-10-18 | Discharge: 2021-10-18 | Disposition: A | Discharge status: Home / Self Care | Payer: Medicare HMO | Source: Ambulatory Visit | Attending: Anesthesiology | Admitting: Anesthesiology

## 2021-10-18 ENCOUNTER — Encounter (HOSPITAL_COMMUNITY): Payer: Self-pay

## 2021-10-18 ENCOUNTER — Encounter (HOSPITAL_COMMUNITY)
Admission: RE | Admit: 2021-10-18 | Discharge: 2021-10-18 | Disposition: A | Discharge status: Home / Self Care | Payer: Medicare HMO | Source: Ambulatory Visit | Attending: Surgery | Admitting: Surgery

## 2021-10-18 VITALS — BP 138/75 | HR 62 | Temp 97.8°F | Resp 16 | Ht 66.5 in | Wt 173.0 lb

## 2021-10-18 DIAGNOSIS — Z01818 Encounter for other preprocedural examination: Secondary | ICD-10-CM | POA: Diagnosis not present

## 2021-10-18 DIAGNOSIS — I10 Essential (primary) hypertension: Secondary | ICD-10-CM | POA: Diagnosis not present

## 2021-10-18 DIAGNOSIS — E785 Hyperlipidemia, unspecified: Secondary | ICD-10-CM

## 2021-10-18 LAB — BASIC METABOLIC PANEL
Anion gap: 7 (ref 5–15)
BUN: 16 mg/dL (ref 8–23)
CO2: 25 mmol/L (ref 22–32)
Calcium: 9.5 mg/dL (ref 8.9–10.3)
Chloride: 110 mmol/L (ref 98–111)
Creatinine, Ser: 0.72 mg/dL (ref 0.44–1.00)
GFR, Estimated: >60 mL/min (ref >60)
Glucose, Bld: 99 mg/dL (ref 70–99)
Potassium: 3.7 mmol/L (ref 3.5–5.1)
Sodium: 142 mmol/L (ref 135–145)

## 2021-10-18 LAB — CBC
HCT: 39 % (ref 36.0–46.0)
Hemoglobin: 12.6 g/dL (ref 12.0–15.0)
MCH: 28.1 pg (ref 26.0–34.0)
MCHC: 32.3 g/dL (ref 30.0–36.0)
MCV: 86.9 fL (ref 80.0–100.0)
Platelets: 271 10*3/uL (ref 150–400)
RBC: 4.49 MIL/uL (ref 3.87–5.11)
RDW: 14.5 % (ref 11.5–15.5)
WBC: 5.5 10*3/uL (ref 4.0–10.5)
nRBC: 0 % (ref 0.0–0.2)

## 2021-10-19 ENCOUNTER — Other Ambulatory Visit (HOSPITAL_COMMUNITY): Payer: Self-pay

## 2021-10-19 MED ORDER — REPATHA SURECLICK 140 MG/ML ~~LOC~~ SOAJ
SUBCUTANEOUS | 6 refills | Status: DC
  Filled 2021-10-19: qty 2, 28d supply, fill #0
  Filled 2021-11-17: qty 2, 28d supply, fill #1
  Filled 2021-12-13: qty 2, 28d supply, fill #2
  Filled 2022-01-10: qty 2, 28d supply, fill #3
  Filled 2022-02-10: qty 2, 28d supply, fill #4
  Filled 2022-03-07: qty 2, 28d supply, fill #5
  Filled 2022-04-03: qty 2, 28d supply, fill #6

## 2021-10-20 ENCOUNTER — Other Ambulatory Visit (HOSPITAL_COMMUNITY): Payer: Self-pay

## 2021-10-21 ENCOUNTER — Ambulatory Visit (HOSPITAL_COMMUNITY): Payer: Medicare HMO | Admitting: Certified Registered"

## 2021-10-21 ENCOUNTER — Other Ambulatory Visit (HOSPITAL_COMMUNITY): Payer: Self-pay

## 2021-10-21 ENCOUNTER — Encounter (HOSPITAL_COMMUNITY)
Admission: RE | Disposition: A | Discharge status: Home / Self Care | Payer: Self-pay | Source: Ambulatory Visit | Attending: Surgery

## 2021-10-21 ENCOUNTER — Encounter (HOSPITAL_COMMUNITY): Payer: Self-pay | Admitting: Surgery

## 2021-10-21 ENCOUNTER — Other Ambulatory Visit: Payer: Self-pay

## 2021-10-21 ENCOUNTER — Ambulatory Visit (HOSPITAL_BASED_OUTPATIENT_CLINIC_OR_DEPARTMENT_OTHER): Payer: Medicare HMO | Admitting: Certified Registered"

## 2021-10-21 ENCOUNTER — Ambulatory Visit (HOSPITAL_COMMUNITY)
Admission: RE | Admit: 2021-10-21 | Discharge: 2021-10-22 | Disposition: A | Discharge status: Home / Self Care | Payer: Medicare HMO | Source: Ambulatory Visit | Attending: Surgery | Admitting: Surgery

## 2021-10-21 DIAGNOSIS — E063 Autoimmune thyroiditis: Secondary | ICD-10-CM | POA: Diagnosis not present

## 2021-10-21 DIAGNOSIS — R000 Tachycardia, unspecified: Secondary | ICD-10-CM | POA: Insufficient documentation

## 2021-10-21 DIAGNOSIS — E0590 Thyrotoxicosis, unspecified without thyrotoxic crisis or storm: Secondary | ICD-10-CM

## 2021-10-21 DIAGNOSIS — I10 Essential (primary) hypertension: Secondary | ICD-10-CM | POA: Diagnosis not present

## 2021-10-21 DIAGNOSIS — E0520 Thyrotoxicosis with toxic multinodular goiter without thyrotoxic crisis or storm: Secondary | ICD-10-CM | POA: Insufficient documentation

## 2021-10-21 DIAGNOSIS — M1990 Unspecified osteoarthritis, unspecified site: Secondary | ICD-10-CM

## 2021-10-21 DIAGNOSIS — Z01818 Encounter for other preprocedural examination: Secondary | ICD-10-CM

## 2021-10-21 DIAGNOSIS — E042 Nontoxic multinodular goiter: Secondary | ICD-10-CM | POA: Diagnosis present

## 2021-10-21 DIAGNOSIS — E785 Hyperlipidemia, unspecified: Secondary | ICD-10-CM

## 2021-10-21 DIAGNOSIS — R002 Palpitations: Secondary | ICD-10-CM | POA: Diagnosis not present

## 2021-10-21 DIAGNOSIS — K219 Gastro-esophageal reflux disease without esophagitis: Secondary | ICD-10-CM | POA: Diagnosis not present

## 2021-10-21 DIAGNOSIS — Z79899 Other long term (current) drug therapy: Secondary | ICD-10-CM | POA: Insufficient documentation

## 2021-10-21 HISTORY — PX: THYROIDECTOMY: SHX17

## 2021-10-21 SURGERY — THYROIDECTOMY
Anesthesia: General

## 2021-10-21 MED ORDER — HEMOSTATIC AGENTS (NO CHARGE) OPTIME
TOPICAL | Status: DC | PRN
  Administered 2021-10-21: 1

## 2021-10-21 MED ORDER — ONDANSETRON 4 MG PO TBDP: 4.0000 mg | ORAL_TABLET | Freq: Four times a day (QID) | ORAL | Status: DC | PRN

## 2021-10-21 MED ORDER — AMLODIPINE BESYLATE 10 MG PO TABS
10.0000 mg | ORAL_TABLET | Freq: Every day | ORAL | Status: DC
  Administered 2021-10-22: 10 mg via ORAL
  Filled 2021-10-21: qty 1

## 2021-10-21 MED ORDER — DEXAMETHASONE SODIUM PHOSPHATE 10 MG/ML IJ SOLN
INTRAMUSCULAR | Status: DC | PRN
  Administered 2021-10-21: 5 mg via INTRAVENOUS

## 2021-10-21 MED ORDER — METOPROLOL SUCCINATE ER 25 MG PO TB24
25.0000 mg | ORAL_TABLET | Freq: Every day | ORAL | Status: DC
  Administered 2021-10-22: 25 mg via ORAL
  Filled 2021-10-21: qty 1

## 2021-10-21 MED ORDER — SODIUM CHLORIDE 0.45 % IV SOLN: INTRAVENOUS | Status: DC

## 2021-10-21 MED ORDER — ESMOLOL HCL 100 MG/10ML IV SOLN
INTRAVENOUS | Status: DC | PRN
  Administered 2021-10-21: 10 mg via INTRAVENOUS

## 2021-10-21 MED ORDER — CEFAZOLIN SODIUM-DEXTROSE 2-4 GM/100ML-% IV SOLN
2.0000 g | INTRAVENOUS | Status: AC
  Administered 2021-10-21: 2 g via INTRAVENOUS
  Filled 2021-10-21: qty 100

## 2021-10-21 MED ORDER — ACETAMINOPHEN 650 MG RE SUPP: 650.0000 mg | Freq: Four times a day (QID) | RECTAL | Status: DC | PRN

## 2021-10-21 MED ORDER — ACETAMINOPHEN 325 MG PO TABS
650.0000 mg | ORAL_TABLET | Freq: Four times a day (QID) | ORAL | Status: DC | PRN
  Administered 2021-10-22: 650 mg via ORAL
  Filled 2021-10-21: qty 2

## 2021-10-21 MED ORDER — LIP MEDEX EX OINT
TOPICAL_OINTMENT | CUTANEOUS | Status: AC
  Filled 2021-10-21: qty 7

## 2021-10-21 MED ORDER — HYDROMORPHONE HCL 1 MG/ML IJ SOLN
0.2500 mg | INTRAMUSCULAR | Status: DC | PRN
  Administered 2021-10-21 (×3): 0.5 mg via INTRAVENOUS

## 2021-10-21 MED ORDER — OXYCODONE HCL 5 MG/5ML PO SOLN: 5.0000 mg | Freq: Once | ORAL | Status: DC | PRN

## 2021-10-21 MED ORDER — OXYCODONE HCL 5 MG PO TABS: 5.0000 mg | ORAL_TABLET | Freq: Once | ORAL | Status: DC | PRN

## 2021-10-21 MED ORDER — HYDROMORPHONE HCL 1 MG/ML IJ SOLN
INTRAMUSCULAR | Status: AC
  Administered 2021-10-21: 0.5 mg via INTRAVENOUS
  Filled 2021-10-21: qty 2

## 2021-10-21 MED ORDER — ORAL CARE MOUTH RINSE: 15.0000 mL | Freq: Once | OROMUCOSAL | Status: AC

## 2021-10-21 MED ORDER — AMISULPRIDE (ANTIEMETIC) 5 MG/2ML IV SOLN: 10.0000 mg | Freq: Once | INTRAVENOUS | Status: DC | PRN

## 2021-10-21 MED ORDER — FENTANYL CITRATE (PF) 100 MCG/2ML IJ SOLN
INTRAMUSCULAR | Status: DC | PRN
  Administered 2021-10-21 (×2): 50 ug via INTRAVENOUS
  Administered 2021-10-21: 25 ug via INTRAVENOUS
  Administered 2021-10-21: 50 ug via INTRAVENOUS
  Administered 2021-10-21: 25 ug via INTRAVENOUS

## 2021-10-21 MED ORDER — LIDOCAINE HCL (PF) 2 % IJ SOLN
INTRAMUSCULAR | Status: AC
  Filled 2021-10-21: qty 5

## 2021-10-21 MED ORDER — HYDROMORPHONE HCL 1 MG/ML IJ SOLN: 1.0000 mg | INTRAMUSCULAR | Status: DC | PRN

## 2021-10-21 MED ORDER — ONDANSETRON HCL 4 MG/2ML IJ SOLN: 4.0000 mg | Freq: Once | INTRAMUSCULAR | Status: DC | PRN

## 2021-10-21 MED ORDER — MIDAZOLAM HCL 2 MG/2ML IJ SOLN
INTRAMUSCULAR | Status: AC
  Filled 2021-10-21: qty 2

## 2021-10-21 MED ORDER — FENTANYL CITRATE (PF) 250 MCG/5ML IJ SOLN
INTRAMUSCULAR | Status: AC
  Filled 2021-10-21: qty 5

## 2021-10-21 MED ORDER — PROPOFOL 10 MG/ML IV BOLUS
INTRAVENOUS | Status: DC | PRN
  Administered 2021-10-21: 20 mg via INTRAVENOUS
  Administered 2021-10-21: 120 mg via INTRAVENOUS

## 2021-10-21 MED ORDER — ROCURONIUM BROMIDE 10 MG/ML (PF) SYRINGE
PREFILLED_SYRINGE | INTRAVENOUS | Status: AC
Start: 2021-10-21 — End: ?
  Filled 2021-10-21: qty 20

## 2021-10-21 MED ORDER — TRAMADOL HCL 50 MG PO TABS: 50.0000 mg | ORAL_TABLET | Freq: Four times a day (QID) | ORAL | 0 refills | Status: DC | PRN

## 2021-10-21 MED ORDER — CHLORHEXIDINE GLUCONATE 0.12 % MT SOLN
15.0000 mL | Freq: Once | OROMUCOSAL | Status: AC
  Administered 2021-10-21: 15 mL via OROMUCOSAL

## 2021-10-21 MED ORDER — OXYCODONE HCL 5 MG PO TABS
5.0000 mg | ORAL_TABLET | ORAL | Status: DC | PRN
  Filled 2021-10-21: qty 1

## 2021-10-21 MED ORDER — CALCIUM CARBONATE 1250 (500 CA) MG PO TABS
2.0000 | ORAL_TABLET | Freq: Three times a day (TID) | ORAL | Status: DC
  Administered 2021-10-22: 2500 mg via ORAL
  Filled 2021-10-21: qty 2

## 2021-10-21 MED ORDER — ROCURONIUM BROMIDE 100 MG/10ML IV SOLN
INTRAVENOUS | Status: DC | PRN
  Administered 2021-10-21: 30 mg via INTRAVENOUS
  Administered 2021-10-21: 70 mg via INTRAVENOUS

## 2021-10-21 MED ORDER — DEXMEDETOMIDINE (PRECEDEX) IN NS 20 MCG/5ML (4 MCG/ML) IV SYRINGE
PREFILLED_SYRINGE | INTRAVENOUS | Status: DC | PRN
  Administered 2021-10-21: 4 ug via INTRAVENOUS

## 2021-10-21 MED ORDER — ONDANSETRON HCL 4 MG/2ML IJ SOLN
4.0000 mg | Freq: Four times a day (QID) | INTRAMUSCULAR | Status: DC | PRN
  Administered 2021-10-21: 4 mg via INTRAVENOUS
  Filled 2021-10-21: qty 2

## 2021-10-21 MED ORDER — LACTATED RINGERS IV SOLN: INTRAVENOUS | Status: DC

## 2021-10-21 MED ORDER — ACETAMINOPHEN 500 MG PO TABS
1000.0000 mg | ORAL_TABLET | Freq: Once | ORAL | Status: AC
  Administered 2021-10-21: 1000 mg via ORAL
  Filled 2021-10-21: qty 2

## 2021-10-21 MED ORDER — LEVOTHYROXINE SODIUM 88 MCG PO TABS: 88.0000 ug | ORAL_TABLET | Freq: Every day | ORAL | 2 refills | Status: AC

## 2021-10-21 MED ORDER — AZELASTINE HCL 0.1 % NA SOLN
1.0000 | Freq: Two times a day (BID) | NASAL | Status: DC
  Administered 2021-10-21 – 2021-10-22 (×2): 1 via NASAL
  Filled 2021-10-21: qty 30

## 2021-10-21 MED ORDER — TRAMADOL HCL 50 MG PO TABS
50.0000 mg | ORAL_TABLET | Freq: Four times a day (QID) | ORAL | Status: DC | PRN
  Administered 2021-10-21 – 2021-10-22 (×3): 50 mg via ORAL
  Filled 2021-10-21 (×4): qty 1

## 2021-10-21 MED ORDER — LIDOCAINE 2% (20 MG/ML) 5 ML SYRINGE
INTRAMUSCULAR | Status: DC | PRN
  Administered 2021-10-21: 100 mg via INTRAVENOUS

## 2021-10-21 MED ORDER — 0.9 % SODIUM CHLORIDE (POUR BTL) OPTIME
TOPICAL | Status: DC | PRN
  Administered 2021-10-21: 1000 mL

## 2021-10-21 MED ORDER — CALCIUM CARBONATE ANTACID 500 MG PO CHEW: 2.0000 | CHEWABLE_TABLET | Freq: Three times a day (TID) | ORAL | 1 refills | Status: DC

## 2021-10-21 MED ORDER — CHLORHEXIDINE GLUCONATE CLOTH 2 % EX PADS: 6.0000 | MEDICATED_PAD | Freq: Once | CUTANEOUS | Status: DC

## 2021-10-21 MED ORDER — LOSARTAN POTASSIUM 50 MG PO TABS
100.0000 mg | ORAL_TABLET | Freq: Every day | ORAL | Status: DC
  Administered 2021-10-21 – 2021-10-22 (×2): 100 mg via ORAL
  Filled 2021-10-21 (×2): qty 2

## 2021-10-21 MED ORDER — MIDAZOLAM HCL 5 MG/5ML IJ SOLN
INTRAMUSCULAR | Status: DC | PRN
  Administered 2021-10-21: 2 mg via INTRAVENOUS

## 2021-10-21 MED ORDER — LABETALOL HCL 5 MG/ML IV SOLN
INTRAVENOUS | Status: AC
Start: 2021-10-21 — End: ?
  Filled 2021-10-21: qty 4

## 2021-10-21 MED ORDER — SUGAMMADEX SODIUM 200 MG/2ML IV SOLN
INTRAVENOUS | Status: DC | PRN
  Administered 2021-10-21: 200 mg via INTRAVENOUS

## 2021-10-21 SURGICAL SUPPLY — 33 items
ADH SKN CLS APL DERMABOND .7 (GAUZE/BANDAGES/DRESSINGS) ×1
APL PRP STRL LF DISP 70% ISPRP (MISCELLANEOUS) ×1
ATTRACTOMAT 16X20 MAGNETIC DRP (DRAPES) ×3 IMPLANT
BAG COUNTER SPONGE SURGICOUNT (BAG) ×3 IMPLANT
BAG SPNG CNTER NS LX DISP (BAG) ×1
BLADE SURG 15 STRL LF DISP TIS (BLADE) ×2 IMPLANT
BLADE SURG 15 STRL SS (BLADE) ×2
CHLORAPREP W/TINT 26 (MISCELLANEOUS) ×3 IMPLANT
CLIP TI MEDIUM 6 (CLIP) ×6 IMPLANT
CLIP TI WIDE RED SMALL 6 (CLIP) ×8 IMPLANT
COVER SURGICAL LIGHT HANDLE (MISCELLANEOUS) ×3 IMPLANT
DERMABOND ADVANCED (GAUZE/BANDAGES/DRESSINGS) ×1
DERMABOND ADVANCED .7 DNX12 (GAUZE/BANDAGES/DRESSINGS) ×2 IMPLANT
DRAPE LAPAROTOMY T 98X78 PEDS (DRAPES) ×3 IMPLANT
DRAPE UTILITY XL STRL (DRAPES) ×3 IMPLANT
ELECT PENCIL ROCKER SW 15FT (MISCELLANEOUS) ×3 IMPLANT
ELECT REM PT RETURN 15FT ADLT (MISCELLANEOUS) ×3 IMPLANT
GAUZE 4X4 16PLY ~~LOC~~+RFID DBL (SPONGE) ×3 IMPLANT
GLOVE SURG ORTHO 8.0 STRL STRW (GLOVE) ×3 IMPLANT
GOWN STRL REUS W/ TWL XL LVL3 (GOWN DISPOSABLE) ×4 IMPLANT
GOWN STRL REUS W/TWL XL LVL3 (GOWN DISPOSABLE) ×4
HEMOSTAT SURGICEL 2X4 FIBR (HEMOSTASIS) ×3 IMPLANT
ILLUMINATOR WAVEGUIDE N/F (MISCELLANEOUS) ×3 IMPLANT
KIT BASIN OR (CUSTOM PROCEDURE TRAY) ×3 IMPLANT
KIT TURNOVER KIT A (KITS) IMPLANT
PACK BASIC VI WITH GOWN DISP (CUSTOM PROCEDURE TRAY) ×3 IMPLANT
SHEARS HARMONIC 9CM CVD (BLADE) ×3 IMPLANT
SUT MNCRL AB 4-0 PS2 18 (SUTURE) ×3 IMPLANT
SUT VIC AB 3-0 SH 18 (SUTURE) ×6 IMPLANT
SYR BULB IRRIG 60ML STRL (SYRINGE) ×3 IMPLANT
TOWEL OR 17X26 10 PK STRL BLUE (TOWEL DISPOSABLE) ×3 IMPLANT
TOWEL OR NON WOVEN STRL DISP B (DISPOSABLE) ×3 IMPLANT
TUBING CONNECTING 10 (TUBING) ×3 IMPLANT

## 2021-10-21 NOTE — Discharge Instructions (Signed)
[facility] - Dr. Darcella Shiffman  THYROID & PARATHYROID SURGERY:  POST-OP INSTRUCTIONS  Always review the instruction sheet provided by the hospital nurse at discharge.  A prescription for pain medication may be sent to your pharmacy at the time of discharge.  Take your pain medication as prescribed.  If narcotic pain medicine is not needed, then you may take acetaminophen (Tylenol) or ibuprofen (Advil) as needed for pain or soreness.  Take your normal home medications as prescribed unless otherwise directed.  If you need a refill on your pain medication, please contact the office during regular business hours.  Prescriptions will not be processed by the office after 5:00PM or on weekends.  Start with a light diet upon arrival home, such as soup and crackers or toast.  Be sure to drink plenty of fluids.  Resume your normal diet the day after surgery.  Most patients will experience some swelling and bruising on the chest and neck area.  Ice packs will help for the first 48 hours after arriving home.  Swelling and bruising will take several days to resolve.   It is common to experience some constipation after surgery.  Increasing fluid intake and taking a stool softener (Colace) will usually help to prevent this problem.  A mild laxative (Milk of Magnesia or Miralax) should be taken according to package directions if there has been no bowel movement after 48 hours.  Dermabond glue covers your incision. This seals the wound and you may shower at any time. The Dermabond will remain in place for about a week.  You may gradually remove the glue when it loosens around the edges.  If you need to loosen the Dermabond for removal, apply a layer of Vaseline to the wound for 15 minutes and then remove with a Kleenex. Your sutures are under the skin and will not show - they will dissolve on their own.  You may resume light daily activities beginning the day after discharge (such as self-care,  walking, climbing stairs), gradually increasing activities as tolerated. You may have sexual intercourse when it is comfortable. Refrain from any heavy lifting or straining until approved by your doctor. You may drive when you no longer are taking prescription pain medication, you can comfortably wear a seatbelt, and you can safely maneuver your car and apply the brakes.  You will see your doctor in the office for a follow-up appointment approximately three weeks after your surgery.  Make sure that you call for this appointment within a day or two after you arrive home to insure a convenient appointment time. Please have any requested laboratory tests performed a few days prior to your office visit so that the results will be available at your follow up appointment.  WHEN TO CALL THE [facility] OFFICE: -- Fever greater than 101.5 -- Inability to urinate -- Nausea and/or vomiting - persistent -- Extreme swelling or bruising -- Continued bleeding from incision -- Increased pain, redness, or drainage from the incision -- Difficulty swallowing or breathing -- Muscle cramping or spasms -- Numbness or tingling in hands or around lips  The clinic staff is available to answer your questions during regular business hours.  Please don't hesitate to call and ask to speak to one of the nurses if you have concerns.  [facility] OFFICE: [phone] (24 hours)  Please sign up for MyChart accounts. This will allow you to communicate directly with my nurse or myself without having to call the office. It will also allow you   to view your test results. You will need to enroll in MyChart for my office ([facility]) and for the hospital ([place]).  Jalin Alicea, MD [facility] practice 

## 2021-10-21 NOTE — Anesthesia Preprocedure Evaluation (Addendum)
Anesthesia Evaluation  Patient identified by MRN, date of birth, ID band Patient awake    Reviewed: Allergy & Precautions, NPO status , Patient's Chart, lab work & pertinent test results, reviewed documented beta blocker date and time   Airway Mallampati: II  TM Distance: >3 FB Neck ROM: Full    Dental  (+) Partial Upper, Missing, Dental Advisory Given   Pulmonary neg pulmonary ROS,    Pulmonary exam normal breath sounds clear to auscultation       Cardiovascular hypertension, Pt. on medications and Pt. on home beta blockers Normal cardiovascular exam Rhythm:Regular Rate:Normal     Neuro/Psych  Headaches, negative psych ROS   GI/Hepatic Neg liver ROS, GERD  Medicated,  Endo/Other  Hyperthyroidism Multiple thyroid nodules Hyperlipidemia  Renal/GU negative Renal ROS  negative genitourinary   Musculoskeletal  (+) Arthritis , Osteoarthritis,  Chronic LBP   Abdominal   Peds  Hematology negative hematology ROS (+)   Anesthesia Other Findings   Reproductive/Obstetrics                           Anesthesia Physical Anesthesia Plan  ASA: 3  Anesthesia Plan: General   Post-op Pain Management: Precedex and Tylenol PO (pre-op)*   Induction: Intravenous and Cricoid pressure planned  PONV Risk Score and Plan: 4 or greater and Treatment may vary due to age or medical condition, Ondansetron and Dexamethasone  Airway Management Planned: Oral ETT  Additional Equipment: None  Intra-op Plan:   Post-operative Plan: Extubation in OR  Informed Consent: I have reviewed the patients History and Physical, chart, labs and discussed the procedure including the risks, benefits and alternatives for the proposed anesthesia with the patient or authorized representative who has indicated his/her understanding and acceptance.     Dental advisory given  Plan Discussed with: CRNA and  Anesthesiologist  Anesthesia Plan Comments:        Anesthesia Quick Evaluation

## 2021-10-21 NOTE — Anesthesia Procedure Notes (Signed)
Procedure Name: Intubation Date/Time: 10/21/2021 1:50 PM  Performed by: Gwyndolyn Saxon, CRNAPre-anesthesia Checklist: Patient identified, Emergency Drugs available, Suction available and Patient being monitored Patient Re-evaluated:Patient Re-evaluated prior to induction Oxygen Delivery Method: Circle system utilized Preoxygenation: Pre-oxygenation with 100% oxygen Induction Type: IV induction and Cricoid Pressure applied Ventilation: Mask ventilation without difficulty Laryngoscope Size: Miller and 2 Grade View: Grade I Tube type: Oral Tube size: 7.0 mm Number of attempts: 1 Airway Equipment and Method: Patient positioned with wedge pillow and Stylet Placement Confirmation: ETT inserted through vocal cords under direct vision, positive ETCO2 and breath sounds checked- equal and bilateral Secured at: 21 cm Tube secured with: Tape Dental Injury: Teeth and Oropharynx as per pre-operative assessment

## 2021-10-21 NOTE — Anesthesia Postprocedure Evaluation (Signed)
Anesthesia Post Note  Patient: [name]  Procedure(s) Performed: TOTAL THYROIDECTOMY     Patient location during evaluation: PACU Anesthesia Type: General Level of consciousness: awake and alert and oriented Pain management: pain level controlled Vital Signs Assessment: post-procedure vital signs reviewed and stable Respiratory status: spontaneous breathing, nonlabored ventilation and respiratory function stable Cardiovascular status: blood pressure returned to baseline and stable Postop Assessment: no apparent nausea or vomiting Anesthetic complications: no   No notable events documented.  Last Vitals:  Vitals:   10/21/21 1600 10/21/21 1615  BP: (!) 168/91 (!) 161/86  Pulse: 85 82  Resp: 18 15  Temp:  36.7 C  SpO2: 100% 100%    Last Pain:  Vitals:   10/21/21 1615  TempSrc:   PainSc: 5                  Raysean Graumann A.

## 2021-10-21 NOTE — Interval H&P Note (Signed)
History and Physical Interval Note:  10/21/2021 1:18 PM  [name]  has presented today for surgery, with the diagnosis of HYPERTHYROIDISM.  The various methods of treatment have been discussed with the patient and family. After consideration of risks, benefits and other options for treatment, the patient has consented to    Procedure(s): TOTAL THYROIDECTOMY (N/A) as a surgical intervention.    The patient's history has been reviewed, patient examined, no change in status, stable for surgery.  I have reviewed the patient's chart and labs.  Questions were answered to the patient's satisfaction.    Armandina Gemma, [facility] [place] practice Office: [place]

## 2021-10-21 NOTE — Transfer of Care (Signed)
Immediate Anesthesia Transfer of Care Note  Patient: [name]  Procedure(s) Performed: TOTAL THYROIDECTOMY  Patient Location: PACU  Anesthesia Type:General  Level of Consciousness: sedated  Airway & Oxygen Therapy: Patient Spontanous Breathing and Patient connected to face mask oxygen  Post-op Assessment: Report given to RN and Post -op Vital signs reviewed and stable  Post vital signs: Reviewed and stable  Last Vitals:  Vitals Value Taken Time  BP    Temp    Pulse 94 10/21/21 1540  Resp 20 10/21/21 1540  SpO2 100 % 10/21/21 1540  Vitals shown include unvalidated device data.  Last Pain:  Vitals:   10/21/21 1052  TempSrc: Oral  PainSc: 0-No pain      Patients Stated Pain Goal: 4 (01/48/40 3979)  Complications: No notable events documented.

## 2021-10-21 NOTE — Op Note (Signed)
Procedure Note  Pre-operative Diagnosis:  hyperthyroidism, multiple thyroid nodules  Post-operative Diagnosis:  same  Surgeon:  Armandina Gemma, MD  Assistant:  none   Procedure:  Total thyroidectomy  Anesthesia:  General  Estimated Blood Loss:  minimal  Drains: none         Specimen: thyroid to pathology  Indications:  Patient is referred by Dr. Jacelyn Pi for surgical evaluation and management of hyperthyroidism and multiple thyroid nodules. Patient was initially diagnosed in the spring 2023. TSH level was undetectable at less than 0.005. Patient was started on methimazole which she tolerated poorly due to itching. She was treated with 10 days of prednisone. She is on metoprolol for control of palpitations and tachycardia. Patient underwent an ultrasound examination on June 15, 2021. This showed a borderline enlarged thyroid gland with multiple small nodules. None of these met criteria for needle biopsy. Patient also underwent a nuclear medicine thyroid uptake scan on July 11, 2021. This demonstrated a heterogeneous appearing thyroid consistent with multinodular goiter corresponding to the ultrasound appearance. There was an elevated 24-hour iodine uptake. Patient has no prior history of thyroid disease. She has never been on thyroid medication. She has had no prior head or neck surgery. There is no family history of thyroid disease. Patient is retired from [employer]. She is accompanied today by her daughter.  Procedure Details: Procedure was done in OR #1 at the [facility]. The patient was brought to the operating room and placed in a supine position on the operating room table. Following administration of general anesthesia, the patient was positioned and then prepped and draped in the usual aseptic fashion. After ascertaining that an adequate level of anesthesia had been achieved, a small Kocher incision was made with #15 blade. Dissection was carried through subcutaneous  tissues and platysma.Hemostasis was achieved with the electrocautery. Skin flaps were elevated cephalad and caudad from the thyroid notch to the sternal notch. A Mahorner self-retaining retractor was placed for exposure. Strap muscles were incised in the midline and dissection was begun on the left side.  Strap muscles were reflected laterally.  Left thyroid lobe was normal in size with nodules and cysts.  The left lobe was gently mobilized with blunt dissection. Superior pole vessels were dissected out and divided individually between small and medium ligaclips with the harmonic scalpel. The thyroid lobe was rolled anteriorly. Branches of the inferior thyroid artery were divided between small ligaclips with the harmonic scalpel. Inferior venous tributaries were divided between ligaclips. Both the superior and inferior parathyroid glands were identified and preserved on their vascular pedicles. The recurrent laryngeal nerve was identified and preserved along its course. The ligament of Gwenlyn Found was released with the electrocautery and the gland was mobilized onto the anterior trachea. Isthmus was mobilized across the midline. There was small pyramidal lobe present which was resected with the isthmus. Dry pack was placed in the left neck.  The right thyroid lobe was gently mobilized with blunt dissection. Right thyroid lobe was normal in size with nodules. Superior pole vessels were dissected out and divided between small and medium ligaclips with the Harmonic scalpel. Superior parathyroid was identified and preserved. Inferior venous tributaries were divided between medium ligaclips with the harmonic scalpel. The right thyroid lobe was rolled anteriorly and the branches of the inferior thyroid artery divided between small ligaclips. The right recurrent laryngeal nerve was identified and preserved along its course. The ligament of Gwenlyn Found was released with the electrocautery. The right thyroid lobe was mobilized onto  the anterior trachea and the remainder of the thyroid was dissected off the anterior trachea and the thyroid was completely excised. A suture was used to mark the right lobe. The entire thyroid gland was submitted to pathology for review.  The neck was irrigated with warm saline. Fibrillar was placed throughout the operative field. Strap muscles were approximated in the midline with interrupted 3-0 Vicryl sutures. Platysma was closed with interrupted 3-0 Vicryl sutures. Skin was closed with a running 4-0 Monocryl subcuticular suture. Wound was washed and Dermabond was applied. The patient was awakened from anesthesia and brought to the recovery room. The patient tolerated the procedure well.   Armandina Gemma, [facility] Office: [phone]

## 2021-10-22 ENCOUNTER — Encounter (HOSPITAL_COMMUNITY): Payer: Self-pay | Admitting: Surgery

## 2021-10-22 DIAGNOSIS — E0520 Thyrotoxicosis with toxic multinodular goiter without thyrotoxic crisis or storm: Secondary | ICD-10-CM | POA: Diagnosis not present

## 2021-10-22 DIAGNOSIS — R000 Tachycardia, unspecified: Secondary | ICD-10-CM | POA: Diagnosis not present

## 2021-10-22 DIAGNOSIS — R002 Palpitations: Secondary | ICD-10-CM | POA: Diagnosis not present

## 2021-10-22 DIAGNOSIS — I10 Essential (primary) hypertension: Secondary | ICD-10-CM | POA: Diagnosis not present

## 2021-10-22 DIAGNOSIS — K219 Gastro-esophageal reflux disease without esophagitis: Secondary | ICD-10-CM | POA: Diagnosis not present

## 2021-10-22 DIAGNOSIS — Z79899 Other long term (current) drug therapy: Secondary | ICD-10-CM | POA: Diagnosis not present

## 2021-10-22 LAB — BASIC METABOLIC PANEL
Anion gap: 12 (ref 5–15)
BUN: 12 mg/dL (ref 8–23)
CO2: 25 mmol/L (ref 22–32)
Calcium: 8.4 mg/dL — ABNORMAL LOW (ref 8.9–10.3)
Chloride: 102 mmol/L (ref 98–111)
Creatinine, Ser: 0.6 mg/dL (ref 0.44–1.00)
GFR, Estimated: >60 mL/min (ref >60)
Glucose, Bld: 139 mg/dL — ABNORMAL HIGH (ref 70–99)
Potassium: 3.4 mmol/L — ABNORMAL LOW (ref 3.5–5.1)
Sodium: 139 mmol/L (ref 135–145)

## 2021-10-22 NOTE — Discharge Summary (Signed)
Physician Discharge Summary    Patient ID: [name] MRN: [id number] DOB/AGE: 11/04/1955  66 y.o.  Patient Care Team: Merrilee Seashore, MD as PCP - General (Internal Medicine)  Admit date: 10/21/2021  Discharge date: 10/22/2021  Hospital Stay = 0 days    Discharge Diagnoses:  Principal Problem:   Hyperthyroidism Active Problems:   Multiple thyroid nodules   1 Day Post-Op  10/21/2021  POST-OPERATIVE DIAGNOSIS:   HYPERTHYROIDISM  SURGERY:  10/21/2021  Procedure(s): TOTAL THYROIDECTOMY  SURGEON:    Surgeon(s): Armandina Gemma, MD  Consults: [facility] Course:   The patient underwent the surgery above.  Postoperatively, the patient gradually mobilized and advanced to a solid diet.  Pain and other symptoms were treated aggressively.    By the time of discharge, the patient was walking well the hallways, eating food, having flatus.  Calcium level 8.4.  Pain was well-controlled on an oral medications.  Based on meeting discharge criteria and continuing to recover, I felt it was safe for the patient to be discharged from the hospital to further recover with close followup. Postoperative recommendations were discussed in detail with the patient and her daughter at bedside..  They are written as well.  Discharged Condition: good  Discharge Exam: Blood pressure 118/65, pulse 80, temperature 98.6 F (37 C), temperature source Oral, resp. rate 18, height 5' 6.5" (1.689 m), weight 78.5 kg, SpO2 98 %.  General: Pt awake/alert/oriented x4 in No acute distress Eyes: PERRL, normal EOM.  Sclera clear.  No icterus Neuro: CN II-XII intact w/o focal sensory/motor deficits. Lymph: No head/neck/groin lymphadenopathy Psych:  No delerium/psychosis/paranoia HENT: Normocephalic, Mucus membranes moist.  No thrush  Neck: Supple, No tracheal deviation.  Incision with normal healing ridge.  Normal strength of voice.  Minimal hoarseness.  Chest:  No chest wall pain w good  excursion CV:  Pulses intact.  Regular rhythm MS: Normal AROM mjr joints.  No obvious deformity Abdomen: Soft.  Nondistended.  Nontender.   Ext:  SCDs BLE.  No mjr edema.  No cyanosis Skin: No petechiae / purpura   Disposition:    Follow-up Information     Armandina Gemma, MD. Schedule an appointment as soon as possible for a visit in 3 week(s).   Specialty: General Surgery Why: For wound re-check Contact information: [address] [place] [address] [phone]                 Discharge disposition: 01-Home or Self Care       Discharge Instructions     Call MD for:   Complete by: As directed    FEVER > 101.5 F  (temperatures < 101.5 F are not significant)   Call MD for:  extreme fatigue   Complete by: As directed    Call MD for:  persistant dizziness or light-headedness   Complete by: As directed    Call MD for:  persistant nausea and vomiting   Complete by: As directed    Call MD for:  redness, tenderness, or signs of infection (pain, swelling, redness, odor or green/yellow discharge around incision site)   Complete by: As directed    Call MD for:  severe uncontrolled pain   Complete by: As directed    Diet - low sodium heart healthy   Complete by: As directed    Start with a bland diet such as soups, liquids, starchy foods, low fat foods, etc. the first few days at home. Gradually advance to a solid,  low-fat, high fiber diet by the end of the first week at home.   Add a fiber supplement to your diet (Metamucil, etc) If you feel full, bloated, or constipated, stay on a full liquid or pureed/blenderized diet for a few days until you feel better and are no longer constipated.   Discharge instructions   Complete by: As directed    See Discharge Instructions If you are not getting better after two weeks or are noticing you are getting worse, contact our office [phone] for further advice.  We may need to adjust your medications,  re-evaluate you in the office, send you to the emergency room, or see what other things we can do to help. The clinic staff is available to answer your questions during regular business hours (8:30am-5pm).  Please don't hesitate to call and ask to speak to one of our nurses for clinical concerns.    A surgeon from [facility] is always on call at the hospitals 24 hours/day If you have a medical emergency, go to the nearest emergency room or call 911.   Discharge wound care:   Complete by: As directed    It is good for closed incisions and even open wounds to be washed every day.  Shower every day.  Short baths are fine.  Wash the incisions and wounds clean with soap & water.    You may leave closed incisions open to air if it is dry.   You may cover the incision with clean gauze & replace it after your daily shower for comfort.  DERMABOND:  You have purple skin glue (Dermabond) on your incision(s).  Leave them in place, and they will fall off on their own like a scab in 2-3 weeks.  You may trim any edges that curl up with clean scissors.   Driving Restrictions   Complete by: As directed    You may drive when: - you are no longer taking narcotic prescription pain medication - you can comfortably wear a seatbelt - you can safely make sudden turns/stops without pain.   Increase activity slowly   Complete by: As directed    Start light daily activities --- self-care, walking, climbing stairs- beginning the day after surgery.  Gradually increase activities as tolerated.  Control your pain to be active.  Stop when you are tired.  Ideally, walk several times a day, eventually an hour a day.   Most people are back to most day-to-day activities in a few weeks.  It takes 4-6 weeks to get back to unrestricted, intense activity. If you can walk 30 minutes without difficulty, it is safe to try more intense activity such as jogging, treadmill, bicycling, low-impact aerobics, swimming, etc. Save  the most intensive and strenuous activity for last (Usually 4-8 weeks after surgery) such as sit-ups, heavy lifting, contact sports, etc.  Refrain from any intense heavy lifting or straining until you are off narcotics for pain control.  You will have off days, but things should improve week-by-week. DO NOT PUSH THROUGH PAIN.  Let pain be your guide: If it hurts to do something, don't do it.   Lifting restrictions   Complete by: As directed    If you can walk 30 minutes without difficulty, it is safe to try more intense activity such as jogging, treadmill, bicycling, low-impact aerobics, swimming, etc. Save the most intensive and strenuous activity for last (Usually 4-8 weeks after surgery) such as sit-ups, heavy lifting, contact sports, etc.   Refrain  from any intense heavy lifting or straining until you are off narcotics for pain control.  You will have off days, but things should improve week-by-week. DO NOT PUSH THROUGH PAIN.  Let pain be your guide: If it hurts to do something, don't do it.  Pain is your body warning you to avoid that activity for another week until the pain goes down.   May shower / Bathe   Complete by: As directed    May walk up steps   Complete by: As directed    Sexual Activity Restrictions   Complete by: As directed    You may have sexual intercourse when it is comfortable. If it hurts to do something, stop.       Allergies as of 10/22/2021       Reactions   Carafate [sucralfate] Itching   Macrobid [nitrofurantoin Macrocrystal]    Unknown reaction   Methimazole Itching   Statins    Myalgia   Sulfa Antibiotics Itching   Tape    Leaves imprint on skin if left on for too long        Medication List     TAKE these medications    amLODipine 10 MG tablet Commonly known as: NORVASC Take 10 mg by mouth daily.   azelastine 0.1 % nasal spray Commonly known as: ASTELIN Place 1 spray into both nostrils in the morning and at bedtime.   Calcium 600 600 MG  Tabs tablet Generic drug: calcium carbonate Take 600 mg by mouth 2 (two) times daily with a meal.   calcium carbonate 500 MG chewable tablet Commonly known as: Tums Chew 2 tablets (400 mg of elemental calcium total) by mouth 3 (three) times daily.   carboxymethylcellulose 0.5 % Soln Commonly known as: REFRESH PLUS Place 1 drop into both eyes in the morning and at bedtime.   Centrum Silver 50+Women Tabs Take 1 tablet by mouth daily.   famotidine 40 MG tablet Commonly known as: PEPCID Take 40 mg by mouth every other day.   levocetirizine 5 MG tablet Commonly known as: XYZAL Take 5 mg by mouth daily as needed for allergies.   levothyroxine 88 MCG tablet Commonly known as: Synthroid Take 1 tablet (88 mcg total) by mouth daily before breakfast.   losartan 100 MG tablet Commonly known as: COZAAR Take 100 mg by mouth daily.   metoprolol succinate 25 MG 24 hr tablet Commonly known as: TOPROL-XL Take 25 mg by mouth daily.   mineral oil-hydrophilic petrolatum ointment Apply 1 Application topically as needed for dry skin or irritation.   mometasone 50 MCG/ACT nasal spray Commonly known as: Nasonex Place 2 sprays into the nose daily.   Probiotic-10 Caps Take 1 capsule by mouth daily.   Repatha SureClick 962 MG/ML Soaj Generic drug: Evolocumab INJECT '140MG'$  SUBCUTANEOUSLY EVERY 14 DAYS   sodium chloride 0.65 % Soln nasal spray Commonly known as: OCEAN Place 1 spray into both nostrils as needed for congestion.   traMADol 50 MG tablet Commonly known as: ULTRAM Take 1-2 tablets (50-100 mg total) by mouth every 6 (six) hours as needed for moderate pain.   Vitamin D (Cholecalciferol) 10 MCG (400 UNIT) Caps Take 400 Units by mouth 2 (two) times daily.               Discharge Care Instructions  (From admission, onward)           Start     Ordered   10/22/21 0000  Discharge wound care:  Comments: It is good for closed incisions and even open wounds to be  washed every day.  Shower every day.  Short baths are fine.  Wash the incisions and wounds clean with soap & water.    You may leave closed incisions open to air if it is dry.   You may cover the incision with clean gauze & replace it after your daily shower for comfort.  DERMABOND:  You have purple skin glue (Dermabond) on your incision(s).  Leave them in place, and they will fall off on their own like a scab in 2-3 weeks.  You may trim any edges that curl up with clean scissors.   10/22/21 0915            Significant Diagnostic Studies:  Results for orders placed or performed during the hospital encounter of 10/21/21 (from the past 72 hour(s))  Basic metabolic panel     Status: Abnormal   Collection Time: 10/22/21  4:27 AM  Result Value Ref Range   Sodium 139 135 - 145 mmol/L   Potassium 3.4 (L) 3.5 - 5.1 mmol/L   Chloride 102 98 - 111 mmol/L   CO2 25 22 - 32 mmol/L   Glucose, Bld 139 (H) 70 - 99 mg/dL    Comment: Glucose reference range applies only to samples taken after fasting for at least 8 hours.   BUN 12 8 - 23 mg/dL   Creatinine, Ser 0.60 0.44 - 1.00 mg/dL   Calcium 8.4 (L) 8.9 - 10.3 mg/dL   GFR, Estimated >60 >60 mL/min    Comment: (NOTE) Calculated using the CKD-EPI Creatinine Equation (2021)    Anion gap 12 5 - 15    Comment: Performed at [facility], [place] [address] [place], [place] [address]    No results found.  Past Medical History:  Diagnosis Date   AC (acromioclavicular) joint bone spurs, unspecified laterality    Arthritis    Chronic low back pain    GERD (gastroesophageal reflux disease)    Hyperlipemia    Hypertension    Urine incontinence     Past Surgical History:  Procedure Laterality Date   ABDOMINAL HYSTERECTOMY  2003   CARPAL TUNNEL RELEASE     HAMMER TOE SURGERY     HERNIA REPAIR     SHOULDER SURGERY     THYROIDECTOMY N/A 10/21/2021   Procedure: TOTAL THYROIDECTOMY;  Surgeon: Armandina Gemma, MD;  Location: [facility];   Service: General;  Laterality: N/A;   WISDOM TOOTH EXTRACTION      Social History   Socioeconomic History   Marital status: Widowed    Spouse name: Not on file   Number of children: Not on file   Years of education: Not on file   Highest education level: Not on file  Occupational History   Not on file  Tobacco Use   Smoking status: Never   Smokeless tobacco: Never  Vaping Use   Vaping Use: Never used  Substance and Sexual Activity   Alcohol use: Yes    Alcohol/week: 1.0 standard drink of alcohol    Types: 1 Standard drinks or equivalent per week    Comment: rare   Drug use: No   Sexual activity: Not Currently  Other Topics Concern   Not on file  Social History Narrative   Not on file   Social Determinants of Health   Financial Resource Strain: Not on file  Food Insecurity: Not on file  Transportation Needs: Not on  file  Physical Activity: Not on file  Stress: Not on file  Social Connections: Not on file  Intimate Partner Violence: Not on file    Family History  Problem Relation Age of Onset   Diabetes Sister    Hypertension Brother    Alzheimer's disease Mother    Prostate cancer Father    Colon cancer Father     Current Facility-Administered Medications  Medication Dose Route Frequency Provider Last Rate Last Admin   0.45 % sodium chloride infusion   Intravenous Continuous Armandina Gemma, MD 50 mL/hr at 10/21/21 1734 New Bag at 10/21/21 1734   acetaminophen (TYLENOL) tablet 650 mg  650 mg Oral Q6H PRN Armandina Gemma, MD   650 mg at 10/22/21 0106   Or   acetaminophen (TYLENOL) suppository 650 mg  650 mg Rectal Q6H PRN Armandina Gemma, MD       amLODipine (NORVASC) tablet 10 mg  10 mg Oral Daily Armandina Gemma, MD   10 mg at 10/22/21 0843   azelastine (ASTELIN) 0.1 % nasal spray 1 spray  1 spray Each Nare BID Armandina Gemma, MD   1 spray at 10/22/21 0842   calcium carbonate (OS-CAL - dosed in mg of elemental calcium) tablet 2,500 mg  2 tablet Oral TID WC Armandina Gemma,  MD   2,500 mg at 10/22/21 0843   HYDROmorphone (DILAUDID) injection 1 mg  1 mg Intravenous Q2H PRN Armandina Gemma, MD       losartan (COZAAR) tablet 100 mg  100 mg Oral Daily Armandina Gemma, MD   100 mg at 10/22/21 0841   metoprolol succinate (TOPROL-XL) 24 hr tablet 25 mg  25 mg Oral Daily Armandina Gemma, MD   25 mg at 10/22/21 0841   ondansetron (ZOFRAN-ODT) disintegrating tablet 4 mg  4 mg Oral Q6H PRN Armandina Gemma, MD       Or   ondansetron (ZOFRAN) injection 4 mg  4 mg Intravenous Q6H PRN Armandina Gemma, MD   4 mg at 10/21/21 2145   oxyCODONE (Oxy IR/ROXICODONE) immediate release tablet 5-10 mg  5-10 mg Oral Q4H PRN Armandina Gemma, MD       traMADol Veatrice Bourbon) tablet 50 mg  50 mg Oral Q6H PRN Armandina Gemma, MD   50 mg at 10/22/21 2956     Allergies  Allergen Reactions   Carafate [Sucralfate] Itching   Macrobid [Nitrofurantoin Macrocrystal]     Unknown reaction   Methimazole Itching   Statins     Myalgia   Sulfa Antibiotics Itching   Tape     Leaves imprint on skin if left on for too long     Signed:   Adin Hector, MD, FACS, MASCRS Esophageal, Gastrointestinal & Colorectal Surgery Robotic and Minimally Invasive Surgery  [facility] [place] [address], [place], [place] [address] [phone] Fax [phone] [address]  CONTACT INFORMATION:  Weekday (9AM-5PM): Call [facility] main office at [phone]  Weeknight (5PM-9AM) or Weekend/Holiday: Check [email or web address] (password " [email or web address]") for General Surgery [facility] coverage  (Please, do not use SecureChat as it is not reliable communication to reach operating surgeons for immediate patient care)      10/22/2021, 9:15 AM

## 2021-10-22 NOTE — Progress Notes (Signed)
Reviewed written d/c instructions w pt and daughter and all questions answered. They both verbalized understanding. D/C via w/c w all belongings in stable condition

## 2021-10-24 LAB — SURGICAL PATHOLOGY

## 2021-10-25 NOTE — Progress Notes (Signed)
Pathology benign as expected.  Will see in office for post op visit.  tmg  Armandina Gemma, MD [facility] [place] practice Office: [phone]

## 2021-11-03 DIAGNOSIS — E042 Nontoxic multinodular goiter: Secondary | ICD-10-CM | POA: Diagnosis not present

## 2021-11-03 DIAGNOSIS — E890 Postprocedural hypothyroidism: Secondary | ICD-10-CM | POA: Diagnosis not present

## 2021-11-07 DIAGNOSIS — R42 Dizziness and giddiness: Secondary | ICD-10-CM | POA: Diagnosis not present

## 2021-11-07 DIAGNOSIS — E890 Postprocedural hypothyroidism: Secondary | ICD-10-CM | POA: Diagnosis not present

## 2021-11-16 ENCOUNTER — Other Ambulatory Visit (HOSPITAL_COMMUNITY): Payer: Self-pay

## 2021-11-17 ENCOUNTER — Other Ambulatory Visit (HOSPITAL_COMMUNITY): Payer: Self-pay

## 2021-11-22 ENCOUNTER — Other Ambulatory Visit (HOSPITAL_COMMUNITY): Payer: Self-pay

## 2021-11-23 DIAGNOSIS — Z789 Other specified health status: Secondary | ICD-10-CM | POA: Diagnosis not present

## 2021-11-23 DIAGNOSIS — D709 Neutropenia, unspecified: Secondary | ICD-10-CM | POA: Diagnosis not present

## 2021-11-23 DIAGNOSIS — E785 Hyperlipidemia, unspecified: Secondary | ICD-10-CM | POA: Diagnosis not present

## 2021-11-23 DIAGNOSIS — Z0000 Encounter for general adult medical examination without abnormal findings: Secondary | ICD-10-CM | POA: Diagnosis not present

## 2021-11-23 DIAGNOSIS — I700 Atherosclerosis of aorta: Secondary | ICD-10-CM | POA: Diagnosis not present

## 2021-11-23 DIAGNOSIS — I10 Essential (primary) hypertension: Secondary | ICD-10-CM | POA: Diagnosis not present

## 2021-11-28 DIAGNOSIS — H40053 Ocular hypertension, bilateral: Secondary | ICD-10-CM | POA: Diagnosis not present

## 2021-11-28 DIAGNOSIS — H2513 Age-related nuclear cataract, bilateral: Secondary | ICD-10-CM | POA: Diagnosis not present

## 2021-11-28 DIAGNOSIS — H04123 Dry eye syndrome of bilateral lacrimal glands: Secondary | ICD-10-CM | POA: Diagnosis not present

## 2021-11-30 DIAGNOSIS — K219 Gastro-esophageal reflux disease without esophagitis: Secondary | ICD-10-CM | POA: Diagnosis not present

## 2021-11-30 DIAGNOSIS — E890 Postprocedural hypothyroidism: Secondary | ICD-10-CM | POA: Diagnosis not present

## 2021-11-30 DIAGNOSIS — Z780 Asymptomatic menopausal state: Secondary | ICD-10-CM | POA: Diagnosis not present

## 2021-11-30 DIAGNOSIS — I10 Essential (primary) hypertension: Secondary | ICD-10-CM | POA: Diagnosis not present

## 2021-11-30 DIAGNOSIS — E785 Hyperlipidemia, unspecified: Secondary | ICD-10-CM | POA: Diagnosis not present

## 2021-11-30 DIAGNOSIS — I700 Atherosclerosis of aorta: Secondary | ICD-10-CM | POA: Diagnosis not present

## 2021-11-30 DIAGNOSIS — Z0000 Encounter for general adult medical examination without abnormal findings: Secondary | ICD-10-CM | POA: Diagnosis not present

## 2021-12-12 DIAGNOSIS — E890 Postprocedural hypothyroidism: Secondary | ICD-10-CM | POA: Diagnosis not present

## 2021-12-12 DIAGNOSIS — R252 Cramp and spasm: Secondary | ICD-10-CM | POA: Diagnosis not present

## 2021-12-13 ENCOUNTER — Other Ambulatory Visit (HOSPITAL_COMMUNITY): Payer: Self-pay

## 2021-12-13 DIAGNOSIS — E890 Postprocedural hypothyroidism: Secondary | ICD-10-CM | POA: Diagnosis not present

## 2021-12-20 ENCOUNTER — Other Ambulatory Visit (HOSPITAL_COMMUNITY): Payer: Self-pay

## 2021-12-20 DIAGNOSIS — E890 Postprocedural hypothyroidism: Secondary | ICD-10-CM | POA: Diagnosis not present

## 2021-12-30 DIAGNOSIS — Z23 Encounter for immunization: Secondary | ICD-10-CM | POA: Diagnosis not present

## 2022-01-02 DIAGNOSIS — H2513 Age-related nuclear cataract, bilateral: Secondary | ICD-10-CM | POA: Diagnosis not present

## 2022-01-10 ENCOUNTER — Other Ambulatory Visit (HOSPITAL_COMMUNITY): Payer: Self-pay

## 2022-01-11 DIAGNOSIS — Z01419 Encounter for gynecological examination (general) (routine) without abnormal findings: Secondary | ICD-10-CM | POA: Diagnosis not present

## 2022-01-11 DIAGNOSIS — N898 Other specified noninflammatory disorders of vagina: Secondary | ICD-10-CM | POA: Diagnosis not present

## 2022-01-12 ENCOUNTER — Other Ambulatory Visit (HOSPITAL_COMMUNITY): Payer: Self-pay

## 2022-01-13 ENCOUNTER — Other Ambulatory Visit (HOSPITAL_COMMUNITY): Payer: Self-pay

## 2022-01-16 ENCOUNTER — Other Ambulatory Visit (HOSPITAL_COMMUNITY): Payer: Self-pay

## 2022-01-17 DIAGNOSIS — E0520 Thyrotoxicosis with toxic multinodular goiter without thyrotoxic crisis or storm: Secondary | ICD-10-CM | POA: Diagnosis not present

## 2022-01-17 DIAGNOSIS — I10 Essential (primary) hypertension: Secondary | ICD-10-CM | POA: Diagnosis not present

## 2022-01-17 DIAGNOSIS — E0590 Thyrotoxicosis, unspecified without thyrotoxic crisis or storm: Secondary | ICD-10-CM | POA: Diagnosis not present

## 2022-01-19 DIAGNOSIS — M79644 Pain in right finger(s): Secondary | ICD-10-CM | POA: Diagnosis not present

## 2022-01-19 DIAGNOSIS — Z23 Encounter for immunization: Secondary | ICD-10-CM | POA: Diagnosis not present

## 2022-01-26 DIAGNOSIS — M65311 Trigger thumb, right thumb: Secondary | ICD-10-CM | POA: Diagnosis not present

## 2022-01-31 DIAGNOSIS — E890 Postprocedural hypothyroidism: Secondary | ICD-10-CM | POA: Diagnosis not present

## 2022-02-07 ENCOUNTER — Other Ambulatory Visit (HOSPITAL_COMMUNITY): Payer: Self-pay

## 2022-02-10 ENCOUNTER — Other Ambulatory Visit (HOSPITAL_COMMUNITY): Payer: Self-pay

## 2022-02-22 DIAGNOSIS — H269 Unspecified cataract: Secondary | ICD-10-CM | POA: Diagnosis not present

## 2022-02-22 DIAGNOSIS — H2512 Age-related nuclear cataract, left eye: Secondary | ICD-10-CM | POA: Diagnosis not present

## 2022-03-07 ENCOUNTER — Other Ambulatory Visit: Payer: Self-pay

## 2022-03-07 ENCOUNTER — Other Ambulatory Visit (HOSPITAL_COMMUNITY): Payer: Self-pay

## 2022-03-08 ENCOUNTER — Other Ambulatory Visit: Payer: Self-pay

## 2022-03-15 ENCOUNTER — Other Ambulatory Visit: Payer: Self-pay | Admitting: Obstetrics and Gynecology

## 2022-03-15 DIAGNOSIS — Z1231 Encounter for screening mammogram for malignant neoplasm of breast: Secondary | ICD-10-CM

## 2022-03-22 DIAGNOSIS — E890 Postprocedural hypothyroidism: Secondary | ICD-10-CM | POA: Diagnosis not present

## 2022-03-29 DIAGNOSIS — E890 Postprocedural hypothyroidism: Secondary | ICD-10-CM | POA: Diagnosis not present

## 2022-04-03 ENCOUNTER — Other Ambulatory Visit (HOSPITAL_COMMUNITY): Payer: Self-pay

## 2022-04-20 DIAGNOSIS — K5730 Diverticulosis of large intestine without perforation or abscess without bleeding: Secondary | ICD-10-CM | POA: Diagnosis not present

## 2022-04-20 DIAGNOSIS — R1032 Left lower quadrant pain: Secondary | ICD-10-CM | POA: Diagnosis not present

## 2022-04-20 DIAGNOSIS — K219 Gastro-esophageal reflux disease without esophagitis: Secondary | ICD-10-CM | POA: Diagnosis not present

## 2022-04-20 DIAGNOSIS — K5904 Chronic idiopathic constipation: Secondary | ICD-10-CM | POA: Diagnosis not present

## 2022-04-20 DIAGNOSIS — Z86010 Personal history of colonic polyps: Secondary | ICD-10-CM | POA: Diagnosis not present

## 2022-04-20 DIAGNOSIS — R1310 Dysphagia, unspecified: Secondary | ICD-10-CM | POA: Diagnosis not present

## 2022-04-24 DIAGNOSIS — J300 Vasomotor rhinitis: Secondary | ICD-10-CM | POA: Diagnosis not present

## 2022-04-28 DIAGNOSIS — H5213 Myopia, bilateral: Secondary | ICD-10-CM | POA: Diagnosis not present

## 2022-04-28 DIAGNOSIS — H524 Presbyopia: Secondary | ICD-10-CM | POA: Diagnosis not present

## 2022-04-28 DIAGNOSIS — H52209 Unspecified astigmatism, unspecified eye: Secondary | ICD-10-CM | POA: Diagnosis not present

## 2022-05-01 ENCOUNTER — Other Ambulatory Visit (HOSPITAL_COMMUNITY): Payer: Self-pay

## 2022-05-02 ENCOUNTER — Other Ambulatory Visit: Payer: Self-pay

## 2022-05-02 ENCOUNTER — Other Ambulatory Visit (HOSPITAL_COMMUNITY): Payer: Self-pay

## 2022-05-02 MED ORDER — REPATHA SURECLICK 140 MG/ML ~~LOC~~ SOAJ
SUBCUTANEOUS | 6 refills | Status: DC
  Filled 2022-05-02: qty 2, 28d supply, fill #0
  Filled 2022-05-29: qty 2, 28d supply, fill #1
  Filled 2022-06-27: qty 2, 28d supply, fill #2
  Filled 2022-07-28: qty 2, 28d supply, fill #3
  Filled 2022-08-24: qty 2, 28d supply, fill #4
  Filled 2022-09-15: qty 2, 28d supply, fill #5
  Filled 2022-10-10: qty 2, 28d supply, fill #6

## 2022-05-05 ENCOUNTER — Other Ambulatory Visit: Payer: Self-pay

## 2022-05-09 ENCOUNTER — Ambulatory Visit
Admission: RE | Admit: 2022-05-09 | Discharge: 2022-05-09 | Disposition: A | Discharge status: Home / Self Care | Payer: Medicare HMO | Source: Ambulatory Visit | Attending: Obstetrics and Gynecology | Admitting: Obstetrics and Gynecology

## 2022-05-09 DIAGNOSIS — Z1231 Encounter for screening mammogram for malignant neoplasm of breast: Secondary | ICD-10-CM

## 2022-05-11 DIAGNOSIS — I700 Atherosclerosis of aorta: Secondary | ICD-10-CM | POA: Diagnosis not present

## 2022-05-11 DIAGNOSIS — I10 Essential (primary) hypertension: Secondary | ICD-10-CM | POA: Diagnosis not present

## 2022-05-11 DIAGNOSIS — Z780 Asymptomatic menopausal state: Secondary | ICD-10-CM | POA: Diagnosis not present

## 2022-05-11 DIAGNOSIS — E785 Hyperlipidemia, unspecified: Secondary | ICD-10-CM | POA: Diagnosis not present

## 2022-05-11 DIAGNOSIS — E890 Postprocedural hypothyroidism: Secondary | ICD-10-CM | POA: Diagnosis not present

## 2022-05-11 DIAGNOSIS — K219 Gastro-esophageal reflux disease without esophagitis: Secondary | ICD-10-CM | POA: Diagnosis not present

## 2022-05-29 ENCOUNTER — Other Ambulatory Visit: Payer: Self-pay

## 2022-05-29 ENCOUNTER — Other Ambulatory Visit (HOSPITAL_COMMUNITY): Payer: Self-pay

## 2022-06-02 ENCOUNTER — Other Ambulatory Visit: Payer: Self-pay

## 2022-06-06 DIAGNOSIS — Z23 Encounter for immunization: Secondary | ICD-10-CM | POA: Diagnosis not present

## 2022-06-06 DIAGNOSIS — I10 Essential (primary) hypertension: Secondary | ICD-10-CM | POA: Diagnosis not present

## 2022-06-06 DIAGNOSIS — Z780 Asymptomatic menopausal state: Secondary | ICD-10-CM | POA: Diagnosis not present

## 2022-06-06 DIAGNOSIS — R002 Palpitations: Secondary | ICD-10-CM | POA: Diagnosis not present

## 2022-06-06 DIAGNOSIS — I700 Atherosclerosis of aorta: Secondary | ICD-10-CM | POA: Diagnosis not present

## 2022-06-06 DIAGNOSIS — K219 Gastro-esophageal reflux disease without esophagitis: Secondary | ICD-10-CM | POA: Diagnosis not present

## 2022-06-06 DIAGNOSIS — E890 Postprocedural hypothyroidism: Secondary | ICD-10-CM | POA: Diagnosis not present

## 2022-06-06 DIAGNOSIS — E785 Hyperlipidemia, unspecified: Secondary | ICD-10-CM | POA: Diagnosis not present

## 2022-06-06 DIAGNOSIS — R0602 Shortness of breath: Secondary | ICD-10-CM | POA: Diagnosis not present

## 2022-06-07 DIAGNOSIS — R350 Frequency of micturition: Secondary | ICD-10-CM | POA: Diagnosis not present

## 2022-06-07 DIAGNOSIS — R0602 Shortness of breath: Secondary | ICD-10-CM | POA: Diagnosis not present

## 2022-06-27 ENCOUNTER — Other Ambulatory Visit (HOSPITAL_COMMUNITY): Payer: Self-pay

## 2022-06-28 ENCOUNTER — Other Ambulatory Visit (HOSPITAL_COMMUNITY): Payer: Self-pay

## 2022-06-29 DIAGNOSIS — N9089 Other specified noninflammatory disorders of vulva and perineum: Secondary | ICD-10-CM | POA: Diagnosis not present

## 2022-07-03 DIAGNOSIS — R350 Frequency of micturition: Secondary | ICD-10-CM | POA: Diagnosis not present

## 2022-07-14 DIAGNOSIS — E890 Postprocedural hypothyroidism: Secondary | ICD-10-CM | POA: Diagnosis not present

## 2022-07-26 ENCOUNTER — Other Ambulatory Visit (HOSPITAL_COMMUNITY): Payer: Self-pay

## 2022-07-28 ENCOUNTER — Other Ambulatory Visit (HOSPITAL_COMMUNITY): Payer: Self-pay

## 2022-07-28 ENCOUNTER — Other Ambulatory Visit: Payer: Self-pay

## 2022-08-17 DIAGNOSIS — M2042 Other hammer toe(s) (acquired), left foot: Secondary | ICD-10-CM | POA: Diagnosis not present

## 2022-08-17 DIAGNOSIS — M79671 Pain in right foot: Secondary | ICD-10-CM | POA: Diagnosis not present

## 2022-08-17 DIAGNOSIS — M79672 Pain in left foot: Secondary | ICD-10-CM | POA: Diagnosis not present

## 2022-08-17 DIAGNOSIS — M2041 Other hammer toe(s) (acquired), right foot: Secondary | ICD-10-CM | POA: Diagnosis not present

## 2022-08-23 ENCOUNTER — Other Ambulatory Visit (HOSPITAL_COMMUNITY): Payer: Self-pay

## 2022-08-24 ENCOUNTER — Other Ambulatory Visit (HOSPITAL_COMMUNITY): Payer: Self-pay

## 2022-08-24 DIAGNOSIS — I10 Essential (primary) hypertension: Secondary | ICD-10-CM | POA: Diagnosis not present

## 2022-08-25 ENCOUNTER — Other Ambulatory Visit (HOSPITAL_COMMUNITY): Payer: Self-pay

## 2022-08-28 IMAGING — MG DIGITAL SCREENING BILAT W/ TOMO W/ CAD
8 series · 8 of 24 positions shown · non-contrast
Comparison: Previous exam(s).

CLINICAL DATA: Screening.

EXAM:
DIGITAL SCREENING BILATERAL MAMMOGRAM WITH TOMO AND CAD

[L CC synth-2D]
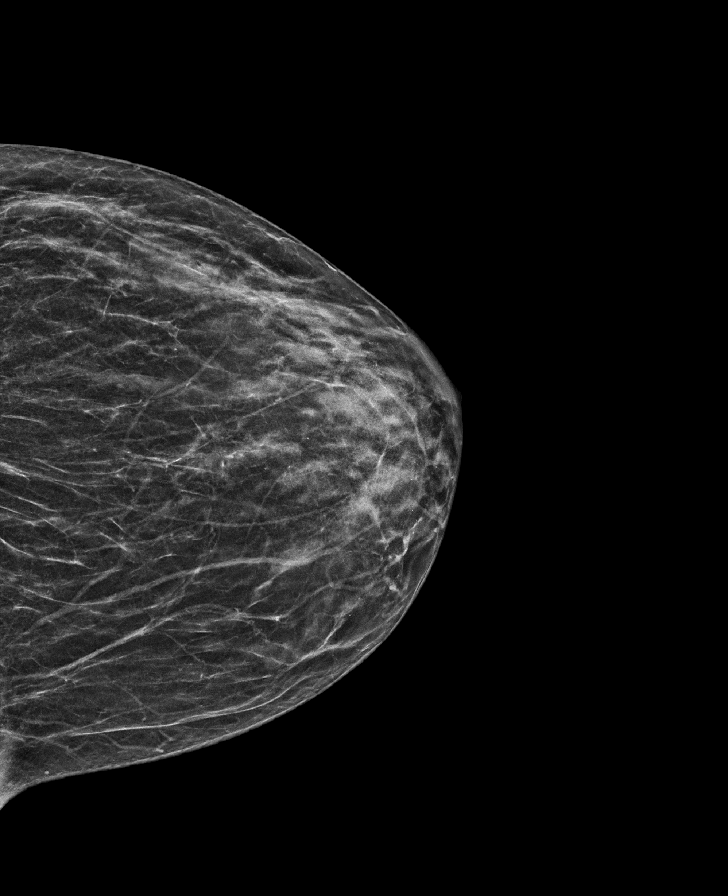

[L MLO synth-2D]
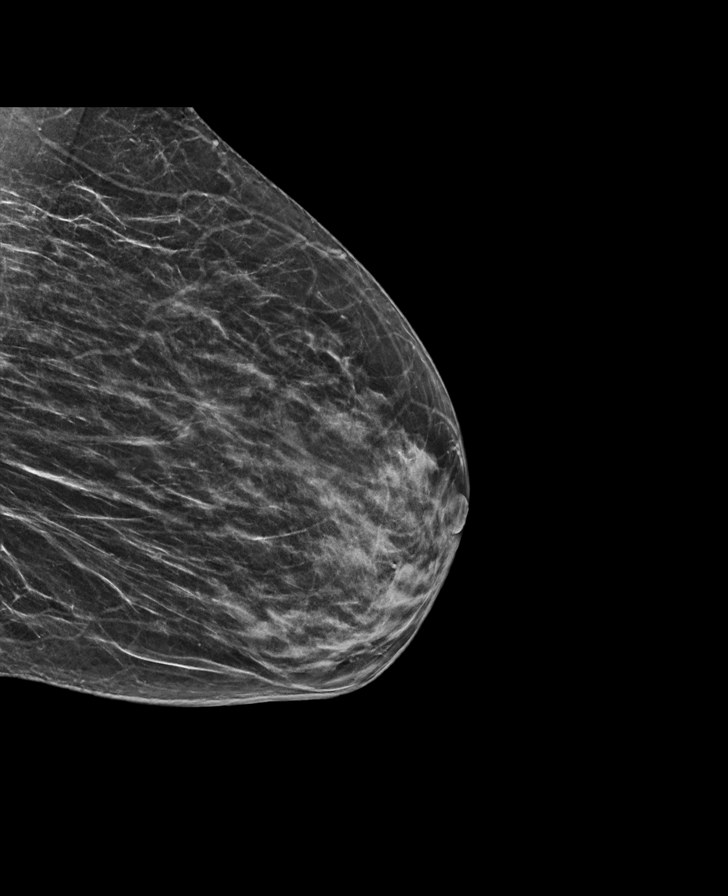

[R CC synth-2D]
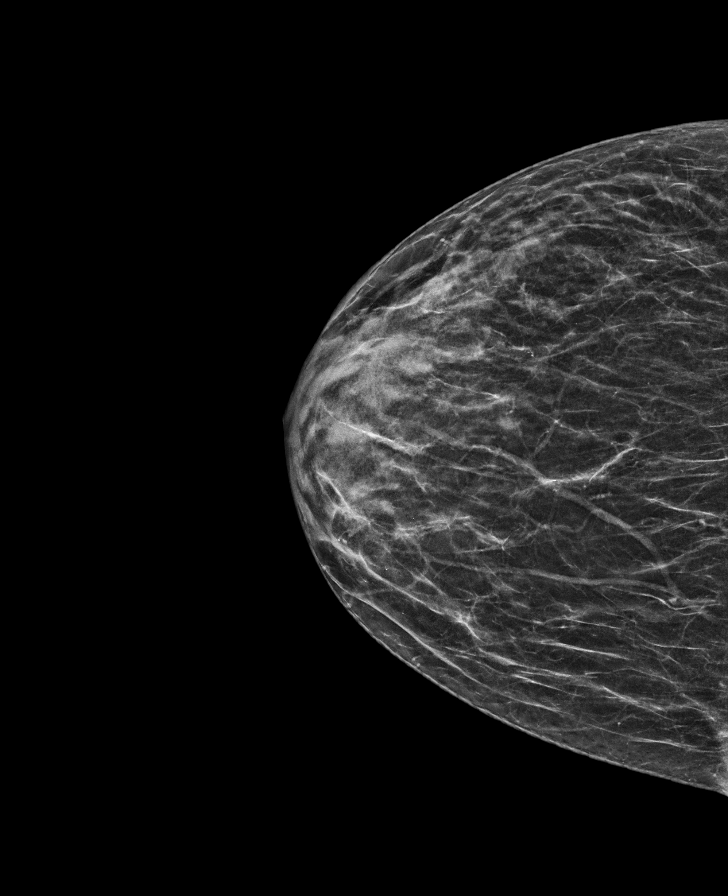

[R MLO synth-2D]
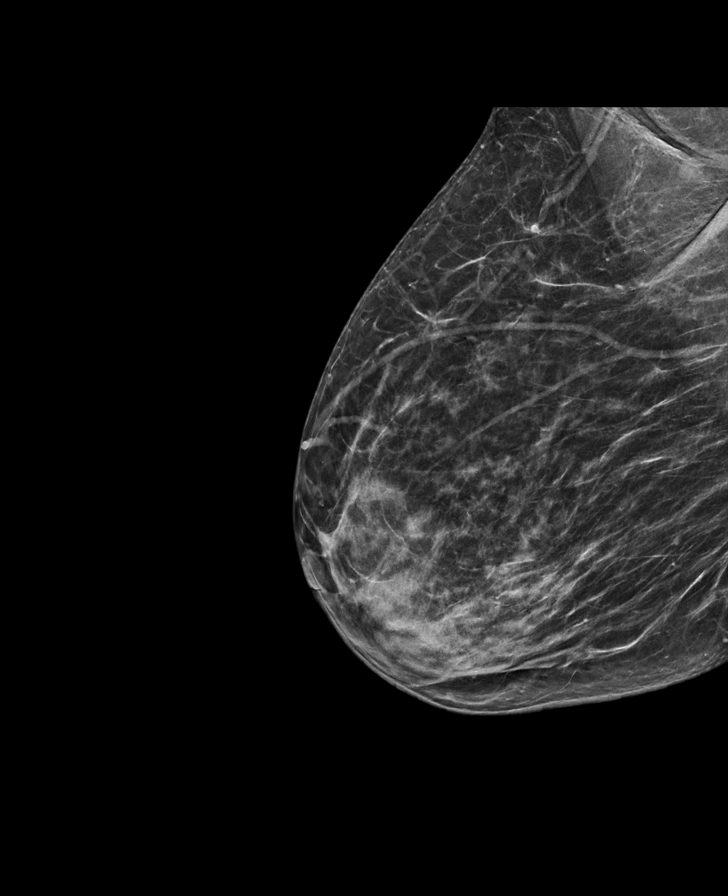

[L MLO tomo · tomo slice 27/53.0]
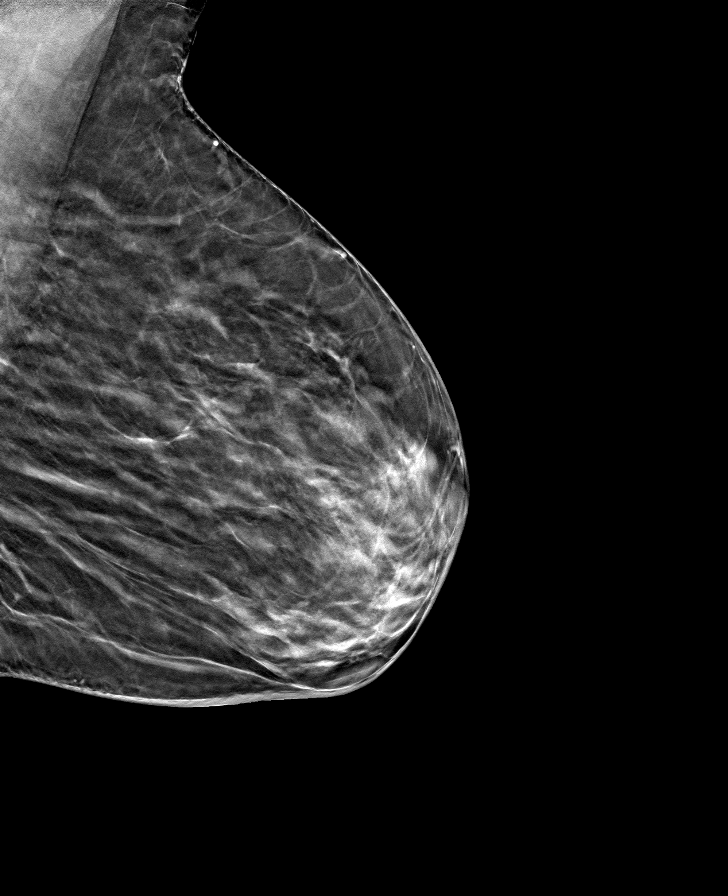

[L CC tomo · tomo slice 25/48.0]
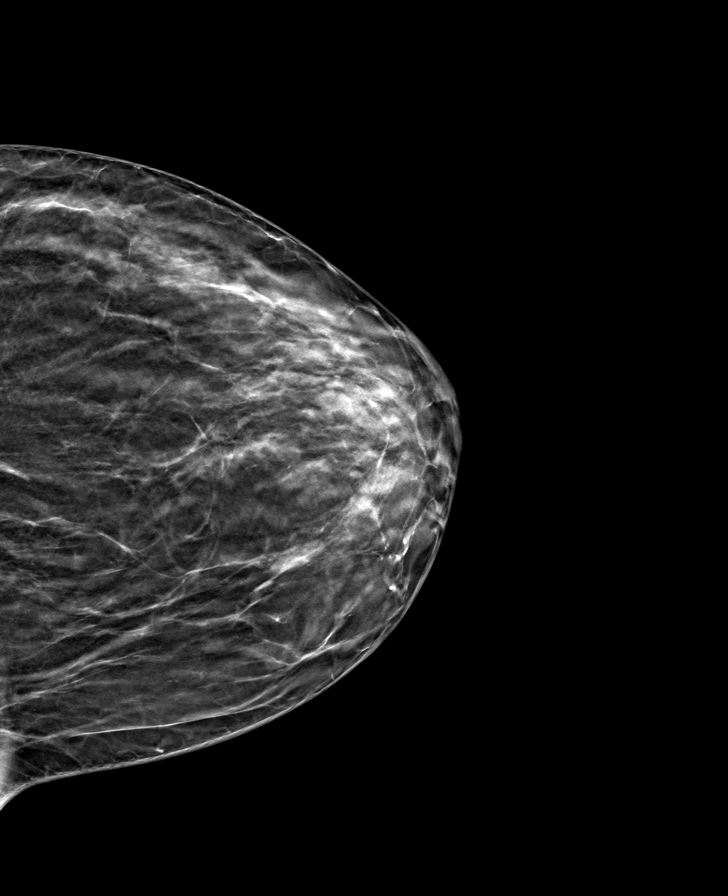

[R CC tomo · tomo slice 25/49.0]
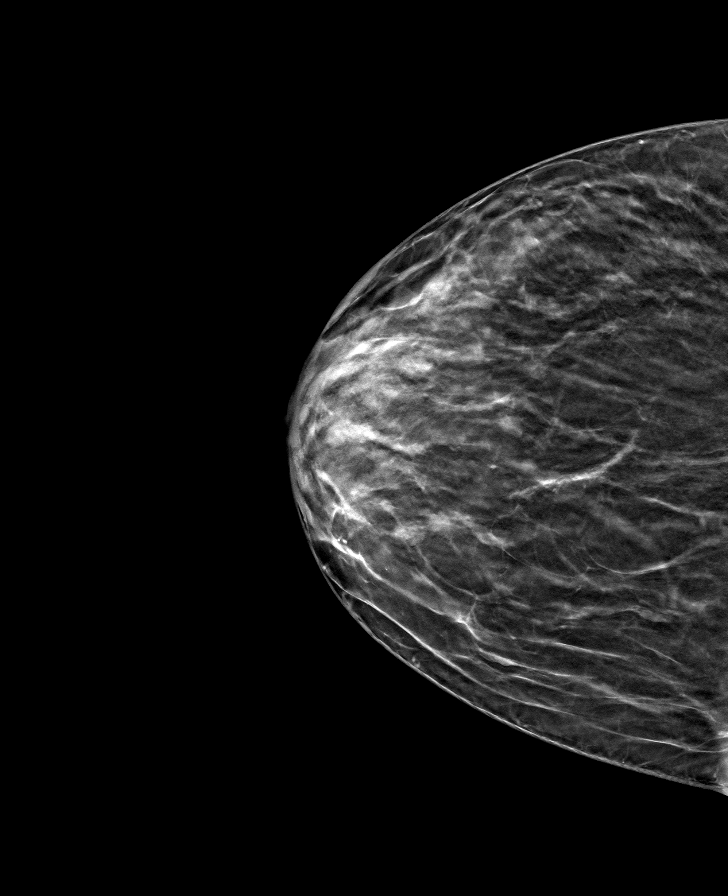

[R MLO tomo · tomo slice 30/59.0]
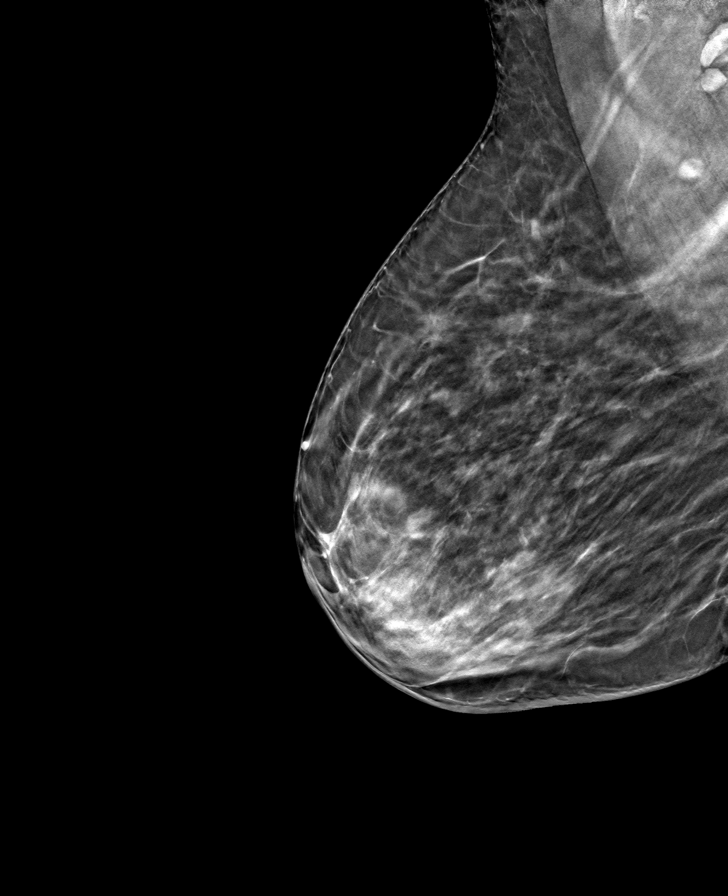

[8 of 24 positions shown; findings below may reference images not displayed]

ACR Breast Density Category c: The breast tissue is heterogeneously
dense, which may obscure small masses.
FINDINGS: There are no findings suspicious for malignancy. Images were
processed with CAD.
IMPRESSION: No mammographic evidence of malignancy. A result letter of this
screening mammogram will be mailed directly to the patient.

RECOMMENDATION:
Screening mammogram in one year. (Code:FT-U-LHB)

BI-RADS CATEGORY  1: Negative.

## 2022-08-29 DIAGNOSIS — H10413 Chronic giant papillary conjunctivitis, bilateral: Secondary | ICD-10-CM | POA: Diagnosis not present

## 2022-08-29 DIAGNOSIS — Z961 Presence of intraocular lens: Secondary | ICD-10-CM | POA: Diagnosis not present

## 2022-08-29 DIAGNOSIS — H2511 Age-related nuclear cataract, right eye: Secondary | ICD-10-CM | POA: Diagnosis not present

## 2022-09-06 DIAGNOSIS — I10 Essential (primary) hypertension: Secondary | ICD-10-CM | POA: Diagnosis not present

## 2022-09-06 DIAGNOSIS — M5431 Sciatica, right side: Secondary | ICD-10-CM | POA: Diagnosis not present

## 2022-09-06 DIAGNOSIS — R42 Dizziness and giddiness: Secondary | ICD-10-CM | POA: Diagnosis not present

## 2022-09-15 ENCOUNTER — Other Ambulatory Visit: Payer: Self-pay

## 2022-09-22 ENCOUNTER — Other Ambulatory Visit (HOSPITAL_COMMUNITY): Payer: Self-pay

## 2022-09-27 DIAGNOSIS — E890 Postprocedural hypothyroidism: Secondary | ICD-10-CM | POA: Diagnosis not present

## 2022-10-04 DIAGNOSIS — E890 Postprocedural hypothyroidism: Secondary | ICD-10-CM | POA: Diagnosis not present

## 2022-10-10 ENCOUNTER — Other Ambulatory Visit (HOSPITAL_COMMUNITY): Payer: Self-pay

## 2022-10-10 IMAGING — CT CT ABD-PELV W/ CM
2 of 5 series · 16 of 46 positions shown, 18 images · IV contrast (APPLIED)
Comparison: None.

CLINICAL DATA: Left lower quadrant pain for 2 weeks, nausea

EXAM:
CT ABDOMEN AND PELVIS WITH CONTRAST
TECHNIQUE: Multidetector CT imaging of the abdomen and pelvis was performed
using the standard protocol following bolus administration of
intravenous contrast.
CONTRAST:  100mL OMNIPAQUE IOHEXOL 300 MG/ML  SOLN

[Series 2: axial st · axial · 0.66mm/px · z∈[-444,-84]mm · 13 of 84 slices shown, 15 images]
[im 6/84  soft-tissue]
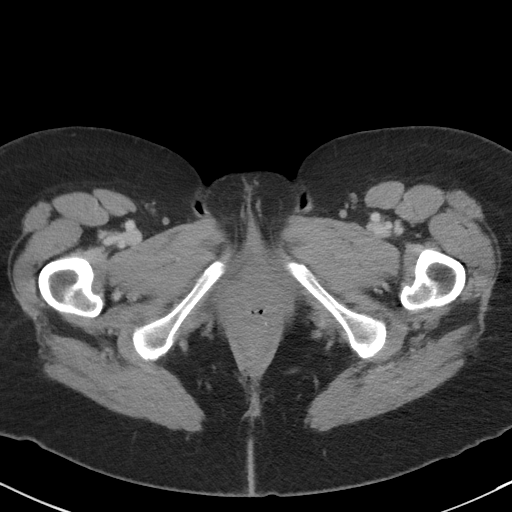
[im 6/84  bone]
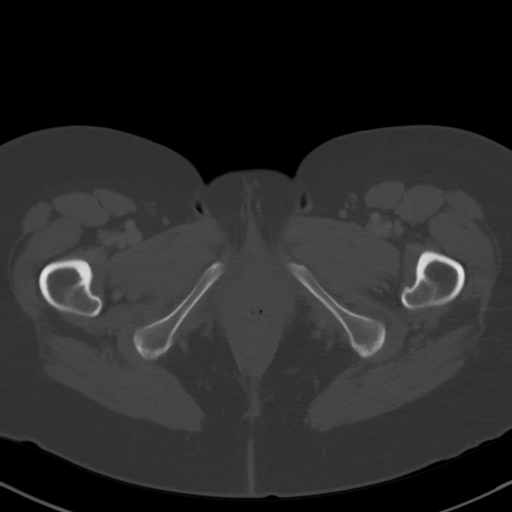
[im 12/84  soft-tissue]
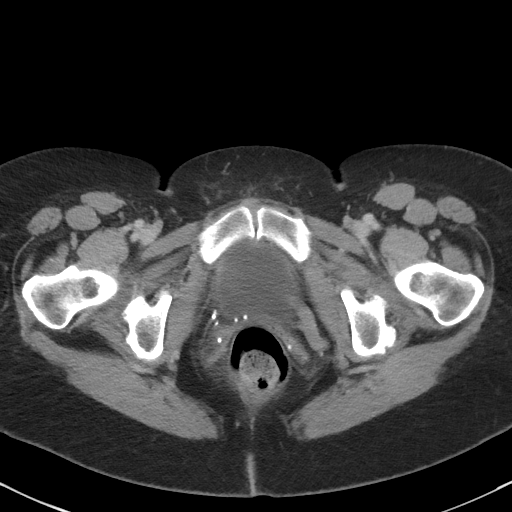
[im 18/84  soft-tissue]
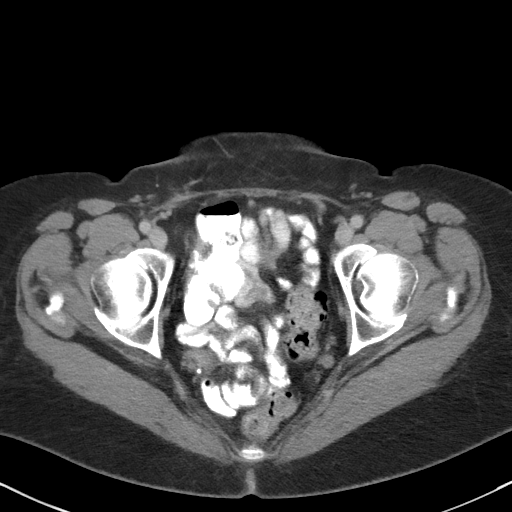
[im 24/84  soft-tissue]
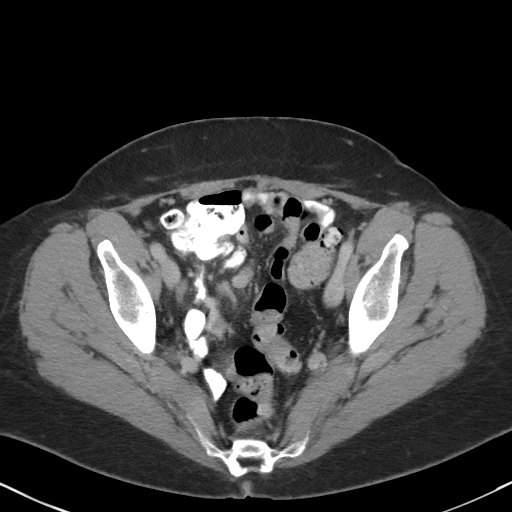
[im 30/84  soft-tissue]
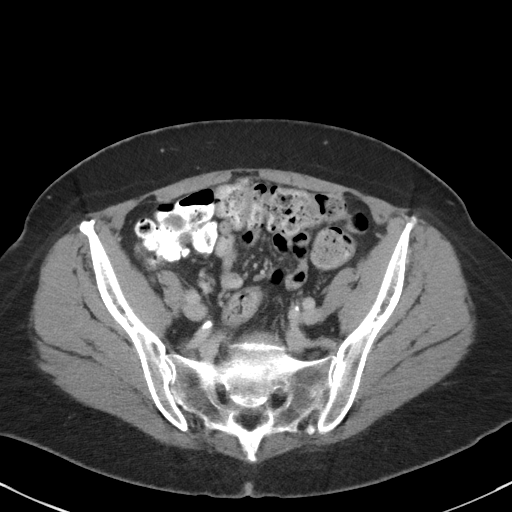
[im 36/84  soft-tissue]
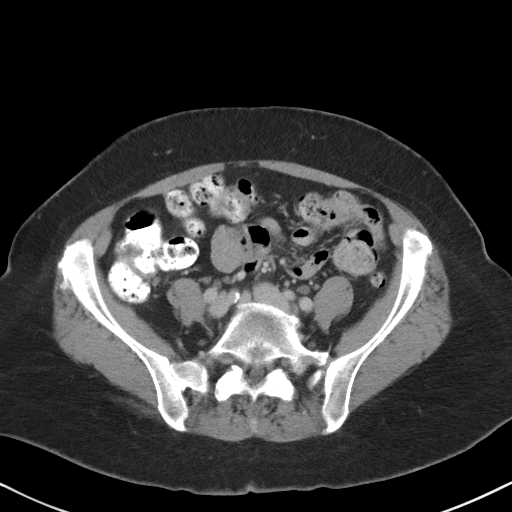
[im 42/84  soft-tissue]
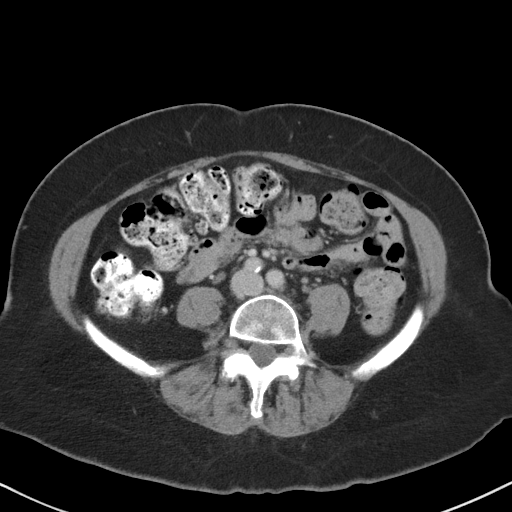
[im 48/84  soft-tissue]
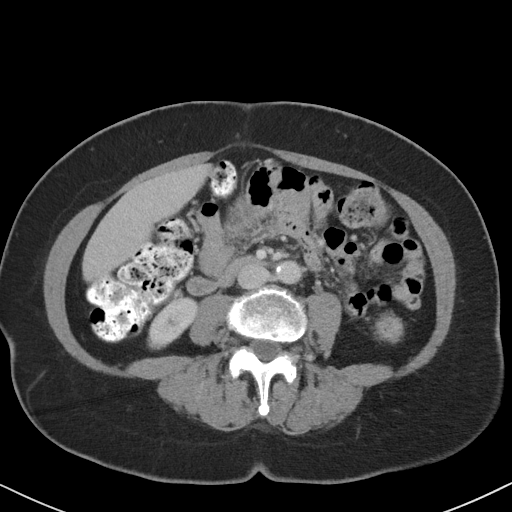
[im 54/84  soft-tissue]
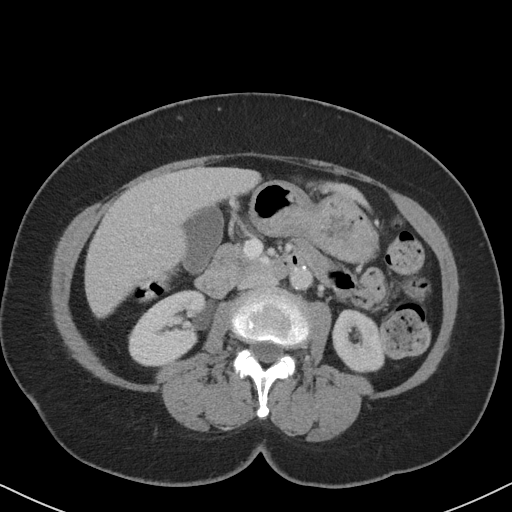
[im 54/84  bone]
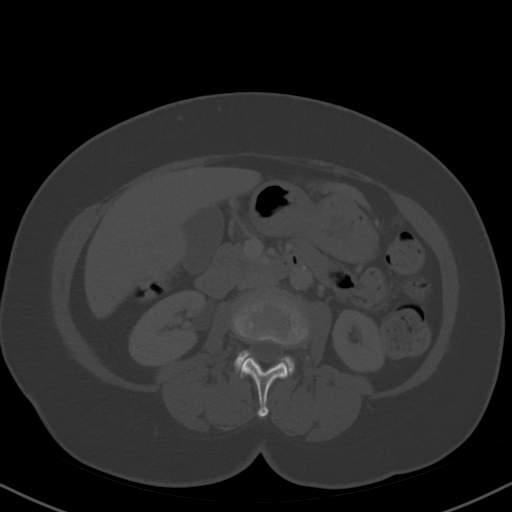
[im 60/84  soft-tissue]
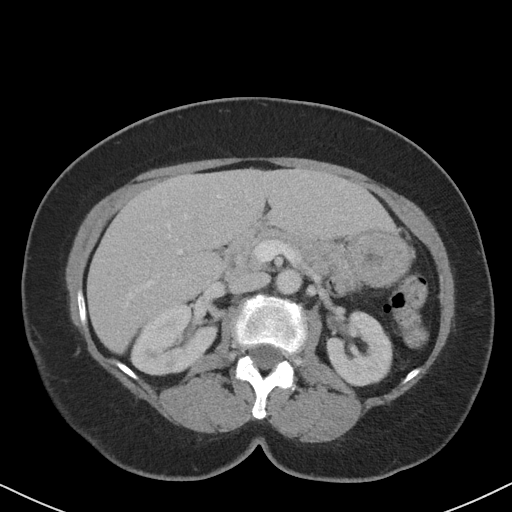
[im 66/84  soft-tissue]
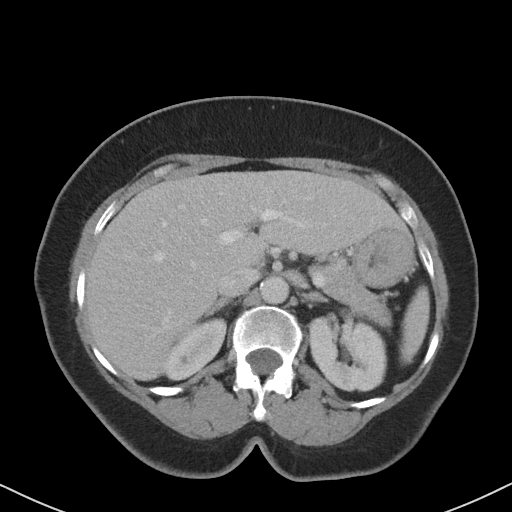
[im 72/84  soft-tissue]
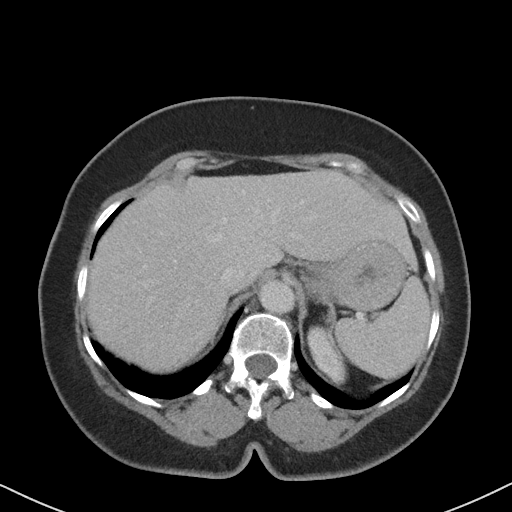
[im 78/84  soft-tissue]
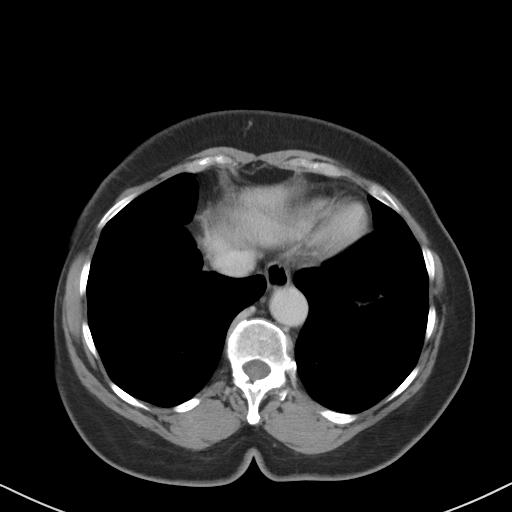

[Series 5: coronal st · coronal · 0.70mm/px · 3 of 84 slices shown]
[im 28/84  soft-tissue]
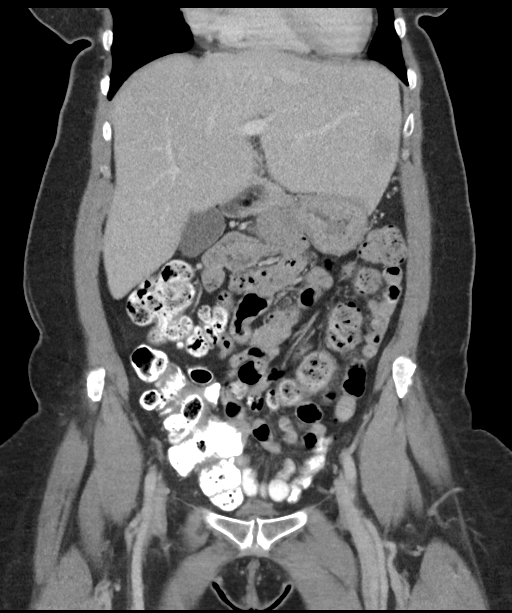
[im 37/84  soft-tissue]
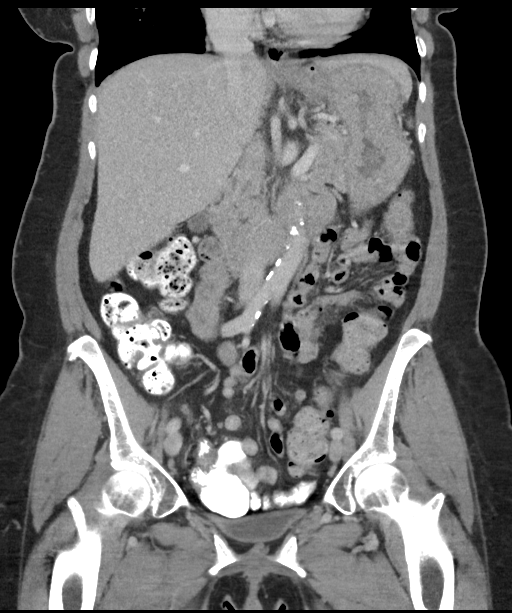
[im 47/84  soft-tissue]
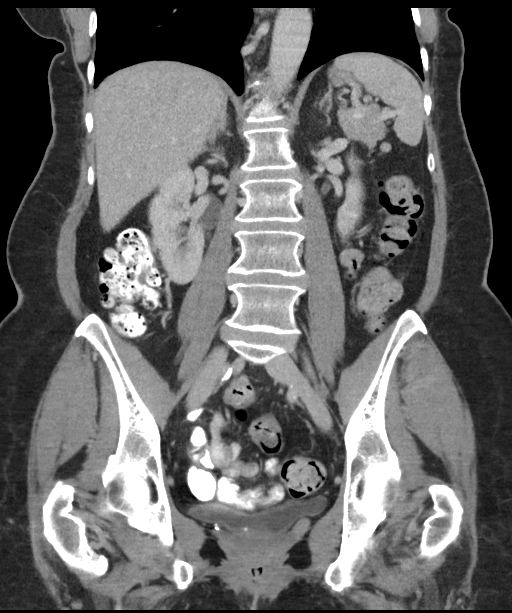

[16 of 46 positions shown; findings below may reference images not displayed]

FINDINGS: Lower chest: No acute pleural or parenchymal lung disease.

Hepatobiliary: No focal liver abnormality is seen. No gallstones,
gallbladder wall thickening, or biliary dilatation.

Pancreas: Unremarkable. No pancreatic ductal dilatation or
surrounding inflammatory changes.

Spleen: Normal in size without focal abnormality.

Adrenals/Urinary Tract: Bilateral renal cortical cysts. Otherwise
the kidneys enhance normally and symmetrically. No urinary tract
calculi or obstructive uropathy. The adrenals and bladder are
unremarkable.

Stomach/Bowel: No bowel obstruction or ileus. Moderate retained
stool throughout the colon. Scattered diverticulosis without acute
diverticulitis. Normal appendix right lower quadrant. No bowel wall
thickening or inflammatory change.

Vascular/Lymphatic: Aortic atherosclerosis. No enlarged abdominal or
pelvic lymph nodes.

Reproductive: Status post hysterectomy. No adnexal masses.

Other: No free fluid or free gas. Small fat containing umbilical
hernia. No bowel herniation.

Musculoskeletal: No acute or destructive bony lesions. Reconstructed
images demonstrate no additional findings.
IMPRESSION: 1. No acute intra-abdominal or intrapelvic process.
2. Diverticulosis without diverticulitis.
3. Moderate fecal retention.
4. Small fat containing umbilical hernia.
5. Aortic Atherosclerosis ([id number]-1L5.5).

## 2022-10-20 ENCOUNTER — Other Ambulatory Visit: Payer: Self-pay

## 2022-10-24 ENCOUNTER — Other Ambulatory Visit (HOSPITAL_COMMUNITY): Payer: Self-pay

## 2022-10-27 DIAGNOSIS — H2511 Age-related nuclear cataract, right eye: Secondary | ICD-10-CM | POA: Diagnosis not present

## 2022-11-13 DIAGNOSIS — E890 Postprocedural hypothyroidism: Secondary | ICD-10-CM | POA: Diagnosis not present

## 2022-11-14 ENCOUNTER — Other Ambulatory Visit: Payer: Self-pay

## 2022-11-14 ENCOUNTER — Other Ambulatory Visit (HOSPITAL_COMMUNITY): Payer: Self-pay

## 2022-11-14 ENCOUNTER — Emergency Department (HOSPITAL_COMMUNITY)
Admission: EM | Admit: 2022-11-14 | Discharge: 2022-11-14 | Disposition: A | Discharge status: Home / Self Care | Payer: Medicare HMO | Attending: Emergency Medicine | Admitting: Emergency Medicine

## 2022-11-14 DIAGNOSIS — E876 Hypokalemia: Secondary | ICD-10-CM | POA: Insufficient documentation

## 2022-11-14 DIAGNOSIS — R531 Weakness: Secondary | ICD-10-CM | POA: Diagnosis present

## 2022-11-14 DIAGNOSIS — I10 Essential (primary) hypertension: Secondary | ICD-10-CM | POA: Insufficient documentation

## 2022-11-14 DIAGNOSIS — Z79899 Other long term (current) drug therapy: Secondary | ICD-10-CM | POA: Insufficient documentation

## 2022-11-14 DIAGNOSIS — R197 Diarrhea, unspecified: Secondary | ICD-10-CM | POA: Diagnosis not present

## 2022-11-14 LAB — URINALYSIS, ROUTINE W REFLEX MICROSCOPIC
Bacteria, UA: NONE SEEN
Bilirubin Urine: NEGATIVE
Glucose, UA: NEGATIVE mg/dL
Hgb urine dipstick: NEGATIVE
Ketones, ur: NEGATIVE mg/dL
Nitrite: NEGATIVE
Protein, ur: NEGATIVE mg/dL
Specific Gravity, Urine: 1.002 — ABNORMAL LOW (ref 1.005–1.030)
pH: 7 (ref 5.0–8.0)

## 2022-11-14 LAB — CBC
HCT: 38.7 % (ref 36.0–46.0)
Hemoglobin: 12.5 g/dL (ref 12.0–15.0)
MCH: 28.3 pg (ref 26.0–34.0)
MCHC: 32.3 g/dL (ref 30.0–36.0)
MCV: 87.6 fL (ref 80.0–100.0)
Platelets: 278 10*3/uL (ref 150–400)
RBC: 4.42 MIL/uL (ref 3.87–5.11)
RDW: 13.8 % (ref 11.5–15.5)
WBC: 5.8 10*3/uL (ref 4.0–10.5)
nRBC: 0 % (ref 0.0–0.2)

## 2022-11-14 LAB — BASIC METABOLIC PANEL
Anion gap: 11 (ref 5–15)
BUN: 15 mg/dL (ref 8–23)
CO2: 27 mmol/L (ref 22–32)
Calcium: 9.2 mg/dL (ref 8.9–10.3)
Chloride: 97 mmol/L — ABNORMAL LOW (ref 98–111)
Creatinine, Ser: 0.85 mg/dL (ref 0.44–1.00)
GFR, Estimated: >60 mL/min (ref >60)
Glucose, Bld: 99 mg/dL (ref 70–99)
Potassium: 2.9 mmol/L — ABNORMAL LOW (ref 3.5–5.1)
Sodium: 135 mmol/L (ref 135–145)

## 2022-11-14 LAB — CBG MONITORING, ED: Glucose-Capillary: 82 mg/dL (ref 70–99)

## 2022-11-14 MED ORDER — POTASSIUM CHLORIDE CRYS ER 20 MEQ PO TBCR
40.0000 meq | EXTENDED_RELEASE_TABLET | Freq: Once | ORAL | Status: AC
  Administered 2022-11-14: 40 meq via ORAL
  Filled 2022-11-14: qty 2

## 2022-11-14 MED ORDER — POTASSIUM CHLORIDE CRYS ER 20 MEQ PO TBCR: 40.0000 meq | EXTENDED_RELEASE_TABLET | Freq: Every day | ORAL | 0 refills | Status: DC

## 2022-11-14 MED ORDER — REPATHA SURECLICK 140 MG/ML ~~LOC~~ SOAJ
SUBCUTANEOUS | 6 refills | Status: DC
  Filled 2022-11-14: qty 2, 28d supply, fill #0
  Filled 2022-12-06: qty 2, 28d supply, fill #1
  Filled 2023-01-09: qty 2, 28d supply, fill #2
  Filled 2023-02-05: qty 2, 28d supply, fill #3
  Filled 2023-03-06: qty 2, 28d supply, fill #4
  Filled 2023-04-04: qty 2, 28d supply, fill #5
  Filled 2023-04-24: qty 2, 28d supply, fill #6

## 2022-11-14 NOTE — Discharge Instructions (Addendum)
Your workup today showed that your potassium was slightly low.  You were given a potassium supplement in the emergency department.  Take another dose at bedtime today.  After today you can take 2 tablets once daily.  Follow-up with your primary care provider to have this repeated in about 3 days.  Continue taking her magnesium supplement.  This is likely due to the medication you are taking called HCTZ that is combined with your losartan.

## 2022-11-14 NOTE — ED Triage Notes (Signed)
Pt with generalized weakness and fatigue and nausea without vomiting or abdominal pain x 3 weeks. Has been to PCP for eval of same with unremarkable blood work completed.

## 2022-11-14 NOTE — ED Provider Notes (Signed)
[place] EMERGENCY DEPARTMENT AT [facility] Provider Note   CSN: [id number] Arrival date & time: 11/14/22  1235     History  Chief Complaint  Patient presents with   Weakness   Nausea    [name] is a 67 y.o. female.  67 year old female hypertension, hyperlipidemia presents today for evaluation of couple week duration of weakness, and fatigue.  Denies any recent illness.  No cough, shortness of breath, chest pain, abdominal pain, dysuria.  Reports compliance with all of her home medications.  She states 3 months ago she was taking irbesartan with HCTZ which was discontinued after couple weeks because patient could not tolerate it.  This was changed to losartan with HCTZ.  Denies any issues with hypokalemia in the past.  Does states she had an episode of loose stools last night.  No longstanding diarrhea.  Denies any vomiting.  The history is provided by the patient. No language interpreter was used.       Home Medications Prior to Admission medications   Medication Sig Start Date End Date Taking? Authorizing Provider  amLODipine (NORVASC) 10 MG tablet Take 10 mg by mouth daily.    [provider]  azelastine (ASTELIN) 0.1 % nasal spray Place 1 spray into both nostrils in the morning and at bedtime. 08/07/21   [provider]  calcium carbonate (CALCIUM 600) 600 MG TABS tablet Take 600 mg by mouth 2 (two) times daily with a meal.    [provider]  calcium carbonate (TUMS) 500 MG chewable tablet Chew 2 tablets (400 mg of elemental calcium total) by mouth 3 (three) times daily. 10/21/21   Darnell Level, MD  carboxymethylcellulose (REFRESH PLUS) 0.5 % SOLN Place 1 drop into both eyes in the morning and at bedtime.    [provider]  Evolocumab (REPATHA SURECLICK) 140 MG/ML SOAJ INJECT 140MG  SUBCUTANEOUSLY EVERY 14 DAYS 11/14/22     famotidine (PEPCID) 40 MG tablet Take 40 mg by mouth every other day.    [provider]   levocetirizine (XYZAL) 5 MG tablet Take 5 mg by mouth daily as needed for allergies.    [provider]  levothyroxine (SYNTHROID) 88 MCG tablet Take 1 tablet (88 mcg total) by mouth daily before breakfast. 10/21/21 10/21/22  Darnell Level, MD  losartan (COZAAR) 100 MG tablet Take 100 mg by mouth daily.    [provider]  metoprolol succinate (TOPROL-XL) 25 MG 24 hr tablet Take 25 mg by mouth daily.    [provider]  mineral oil-hydrophilic petrolatum (AQUAPHOR) ointment Apply 1 Application topically as needed for dry skin or irritation.    [provider]  mometasone (NASONEX) 50 MCG/ACT nasal spray Place 2 sprays into the nose daily. Patient not taking: Reported on 10/14/2021 04/12/16   Dohmeier, Porfirio Mylar, MD  Multiple Vitamins-Minerals (CENTRUM SILVER 50+WOMEN) TABS Take 1 tablet by mouth daily.    [provider]  Probiotic Product (PROBIOTIC-10) CAPS Take 1 capsule by mouth daily.    [provider]  sodium chloride (OCEAN) 0.65 % SOLN nasal spray Place 1 spray into both nostrils as needed for congestion. Patient not taking: Reported on 10/14/2021 04/12/16   Dohmeier, Porfirio Mylar, MD  traMADol (ULTRAM) 50 MG tablet Take 1-2 tablets (50-100 mg total) by mouth every 6 (six) hours as needed for moderate pain. 10/21/21   Darnell Level, MD  Vitamin D, Cholecalciferol, 400 units CAPS Take 400 Units by mouth 2 (two) times daily.    [provider]      Allergies    Carafate [sucralfate], Macrobid [nitrofurantoin macrocrystal], Methimazole, Statins, Sulfa antibiotics, and Tape    Review of Systems   Review of Systems  Constitutional:  Negative for chills and fever.  Respiratory:  Negative for cough and shortness of breath.   Cardiovascular:  Negative for chest pain.  Gastrointestinal:  Positive for diarrhea. Negative for abdominal pain, constipation, nausea and vomiting.  Neurological:  Negative for light-headedness.  All other systems reviewed  and are negative.   Physical Exam Updated Vital Signs BP (!) 140/83 (BP Location: Right Arm)   Pulse 61   Temp 98.3 F (36.8 C) (Oral)   Resp 17   SpO2 100%  Physical Exam Vitals and nursing note reviewed.  Constitutional:      General: She is not in acute distress.    Appearance: Normal appearance. She is not ill-appearing.  HENT:     Head: Normocephalic and atraumatic.     Nose: Nose normal.  Eyes:     Conjunctiva/sclera: Conjunctivae normal.  Cardiovascular:     Rate and Rhythm: Normal rate and regular rhythm.     Heart sounds: Normal heart sounds.  Pulmonary:     Effort: Pulmonary effort is normal. No respiratory distress.  Abdominal:     General: There is no distension.     Palpations: Abdomen is soft.     Tenderness: There is no abdominal tenderness. There is no guarding.  Musculoskeletal:        General: No deformity. Normal range of motion.     Cervical back: Normal range of motion.  Skin:    Findings: No rash.  Neurological:     Mental Status: She is alert.     ED Results / Procedures / Treatments   Labs (all labs ordered are listed, but only abnormal results are displayed) Labs Reviewed  BASIC METABOLIC PANEL - Abnormal; Notable for the following components:      Result Value   Potassium 2.9 (*)    Chloride 97 (*)    All other components within normal limits  URINALYSIS, ROUTINE W REFLEX MICROSCOPIC - Abnormal; Notable for the following components:   Color, Urine STRAW (*)    Specific Gravity, Urine 1.002 (*)    Leukocytes,Ua SMALL (*)    All other components within normal limits  CBC  CBG MONITORING, ED    EKG None  Radiology No results found.  Procedures Procedures    Medications Ordered in ED Medications  potassium chloride SA (KLOR-CON M) CR tablet 40 mEq (has no administration in time range)    ED Course/ Medical Decision Making/ A&P                                 Medical Decision Making Amount and/or Complexity of Data  Reviewed Labs: ordered.  Risk Prescription drug management.   Medical Decision Making / ED Course   This patient presents to the ED for concern of fatigue, this involves an extensive number of treatment options, and is a complaint that carries with it a high risk of complications and morbidity.  The differential diagnosis includes electrolyte derangement, UTI, URI, TIA, CVA, ACS  MDM: 67 year old female presents today for evaluation of fatigue for the past couple weeks duration.  She does take losartan with HCTZ.  Reports compliance with her home medications.  No other evidence of GI loss.  Hypokalemia noted on BMP with  potassium of 2.9.  No other acute finding.  UA without evidence of UTI.  CBC is unremarkable.  Hypokalemia likely related to HCTZ.  Will give potassium repletion here.  Will discharge with 5 days worth of potassium supplement.  Will check magnesium.  She denies chest pain, shortness of breath.  No evidence of anginal symptoms.  Low suspicion for ACS.  EKG obtained.  No acute ischemic changes.  No paresthesias or unilateral weakness.  Low suspicion for CVA.  Patient is appropriate for discharge.  Discharged in stable condition.  Return precautions discussed.  Lab Tests: -I ordered, reviewed, and interpreted labs.   The pertinent results include:   Labs Reviewed  BASIC METABOLIC PANEL - Abnormal; Notable for the following components:      Result Value   Potassium 2.9 (*)    Chloride 97 (*)    All other components within normal limits  URINALYSIS, ROUTINE W REFLEX MICROSCOPIC - Abnormal; Notable for the following components:   Color, Urine STRAW (*)    Specific Gravity, Urine 1.002 (*)    Leukocytes,Ua SMALL (*)    All other components within normal limits  CBC  CBG MONITORING, ED      EKG  EKG Interpretation Date/Time:    Ventricular Rate:    PR Interval:    QRS Duration:    QT Interval:    QTC Calculation:   R Axis:      Text Interpretation:           Medicines ordered and prescription drug management: Meds ordered this encounter  Medications   potassium chloride SA (KLOR-CON M) CR tablet 40 mEq    -I have reviewed the patients home medicines and have made adjustments as needed   Reevaluation: After the interventions noted above, I reevaluated the patient and found that they have :stayed the same  Co morbidities that complicate the patient evaluation  Past Medical History:  Diagnosis Date   AC (acromioclavicular) joint bone spurs, unspecified laterality    Arthritis    Chronic low back pain    GERD (gastroesophageal reflux disease)    Hyperlipemia    Hypertension    Urine incontinence       Dispostion: Patient is appropriate for discharge.  Discharged in stable condition.  Potassium repletion given.  Final Clinical Impression(s) / ED Diagnoses Final diagnoses:  Hypokalemia    Rx / DC Orders ED Discharge Orders          Ordered    potassium chloride SA (KLOR-CON M) 20 MEQ tablet  Daily        11/14/22 1725              Marita Kansas, PA-C 11/14/22 1814    Laurence Spates, MD 11/15/22 [phone]

## 2022-11-17 DIAGNOSIS — E785 Hyperlipidemia, unspecified: Secondary | ICD-10-CM | POA: Diagnosis not present

## 2022-11-17 DIAGNOSIS — E890 Postprocedural hypothyroidism: Secondary | ICD-10-CM | POA: Diagnosis not present

## 2022-11-17 DIAGNOSIS — Z780 Asymptomatic menopausal state: Secondary | ICD-10-CM | POA: Diagnosis not present

## 2022-11-17 DIAGNOSIS — K219 Gastro-esophageal reflux disease without esophagitis: Secondary | ICD-10-CM | POA: Diagnosis not present

## 2022-11-17 DIAGNOSIS — E876 Hypokalemia: Secondary | ICD-10-CM | POA: Diagnosis not present

## 2022-11-17 DIAGNOSIS — I10 Essential (primary) hypertension: Secondary | ICD-10-CM | POA: Diagnosis not present

## 2022-11-17 DIAGNOSIS — I700 Atherosclerosis of aorta: Secondary | ICD-10-CM | POA: Diagnosis not present

## 2022-11-21 DIAGNOSIS — R350 Frequency of micturition: Secondary | ICD-10-CM | POA: Diagnosis not present

## 2022-11-22 ENCOUNTER — Other Ambulatory Visit (HOSPITAL_COMMUNITY): Payer: Self-pay

## 2022-11-22 ENCOUNTER — Other Ambulatory Visit: Payer: Self-pay

## 2022-11-28 ENCOUNTER — Telehealth: Payer: Self-pay

## 2022-11-28 NOTE — Telephone Encounter (Signed)
Transition Care Management Follow-up Telephone Call Date of discharge and from where: 11/14/2022 [facility] How have you been since you were released from the hospital? Patient stated she is not feeling any better. She will follow-up with her Cardiologist on 11/29/2022. Any questions or concerns? No  Items Reviewed: Did the pt receive and understand the discharge instructions provided? Yes  Medications obtained and verified? Yes  Other? No  Any new allergies since your discharge? No  Dietary orders reviewed? Yes Do you have support at home? Yes   Follow up appointments reviewed:  PCP Hospital f/u appt confirmed? No  Scheduled to see  on  @ . Specialist Hospital f/u appt confirmed? Yes  Scheduled to see Yates Decamp, MD on 11/29/2022 @ [facility]. Are transportation arrangements needed? No  If their condition worsens, is the pt aware to call PCP or go to the Emergency Dept.? Yes Was the patient provided with contact information for the PCP's office or ED? Yes Was to pt encouraged to call back with questions or concerns? Yes  Alanta Scobey [facility]  [facility], [facility] Guide Direct Dial: [phone]  Website: [email or web address]

## 2022-11-29 ENCOUNTER — Ambulatory Visit: Payer: Medicare HMO | Admitting: Cardiology

## 2022-11-29 ENCOUNTER — Encounter: Payer: Self-pay | Admitting: Cardiology

## 2022-11-29 VITALS — BP 133/74 | HR 68 | Resp 16 | Ht 66.0 in | Wt 180.6 lb

## 2022-11-29 DIAGNOSIS — R0683 Snoring: Secondary | ICD-10-CM

## 2022-11-29 DIAGNOSIS — R5381 Other malaise: Secondary | ICD-10-CM | POA: Diagnosis not present

## 2022-11-29 DIAGNOSIS — I10 Essential (primary) hypertension: Secondary | ICD-10-CM

## 2022-11-29 DIAGNOSIS — R5383 Other fatigue: Secondary | ICD-10-CM | POA: Diagnosis not present

## 2022-11-29 MED ORDER — IRBESARTAN 300 MG PO TABS: 300.0000 mg | ORAL_TABLET | Freq: Every day | ORAL | Status: AC

## 2022-11-29 NOTE — Progress Notes (Signed)
Primary Physician/Referring:  Georgianne Fick, MD  Patient ID: [name], female    DOB: 10-Sep-1955, 67 y.o.   MRN: [id number]  Chief Complaint  Patient presents with   Fatigue   Follow-up   HPI:    [name]  is a 67 y.o.  Patient with multinodular goiter SP  thyroidectomy August 2023, hypertension, hypercholesterolemia, statin intolerance and is presently on Repatha, seen in the emergency room on 11/14/2022 for fatigue and decreased exercise tolerance ongoing for the past 1 year to year and a half.  She was now referred to me for further evaluation.  Patient was concerned about coronary artery disease and wanted to be evaluated further.  She continues to remain active, denies chest pain, shortness of breath, palpitations, dizziness or syncope.  Symptoms of fatigue have been ongoing for several years at least for 2 to 3 years and especially last year or so she has noticed it to be slightly worse.  Past Medical History:  Diagnosis Date   AC (acromioclavicular) joint bone spurs, unspecified laterality    Arthritis    Chronic low back pain    GERD (gastroesophageal reflux disease)    Hyperlipemia    Hypertension    Urine incontinence    Past Surgical History:  Procedure Laterality Date   ABDOMINAL HYSTERECTOMY  2003   CARPAL TUNNEL RELEASE     HAMMER TOE SURGERY     HERNIA REPAIR     SHOULDER SURGERY     THYROIDECTOMY N/A 10/21/2021   Procedure: TOTAL THYROIDECTOMY;  Surgeon: Darnell Level, MD;  Location: [facility];  Service: General;  Laterality: N/A;   WISDOM TOOTH EXTRACTION     Family History  Problem Relation Age of Onset   Diabetes Sister    Hypertension Brother    Alzheimer's disease Mother    Prostate cancer Father    Colon cancer Father     Social History   Tobacco Use   Smoking status: Never   Smokeless tobacco: Never  Substance Use Topics   Alcohol use: Not Currently    Alcohol/week: 1.0 standard drink of alcohol    Types: 1 Standard drinks or  equivalent per week    Comment: rare   Marital Status: Widowed  ROS  Review of Systems  Constitutional: Positive for malaise/fatigue.  Cardiovascular:  Negative for chest pain, dyspnea on exertion and leg swelling.  Respiratory:  Positive for snoring.    Objective      11/29/2022   11:14 AM 11/14/2022    5:16 PM 11/14/2022   12:52 PM  Vitals with BMI  Height 5\' 6"     Weight 180 lbs 10 oz    BMI 29.16    Systolic 133 146 409  Diastolic 74 74 83  Pulse 68 57 61   Blood pressure 133/74, pulse 68, resp. rate 16, height 5\' 6"  (1.676 m), weight 180 lb 9.6 oz (81.9 kg), SpO2 98%.   Physical Exam Neck:     Vascular: No carotid bruit or JVD.  Cardiovascular:     Rate and Rhythm: Normal rate and regular rhythm.     Pulses: Intact distal pulses.     Heart sounds: Normal heart sounds. No murmur heard.    No gallop.  Pulmonary:     Effort: Pulmonary effort is normal.     Breath sounds: Normal breath sounds.  Abdominal:     General: Bowel sounds are normal.     Palpations: Abdomen is soft.  Musculoskeletal:  Right lower leg: No edema.     Left lower leg: No edema.    Laboratory examination:   Recent Labs    11/14/22 1302  NA 135  K 2.9*  CL 97*  CO2 27  GLUCOSE 99  BUN 15  CREATININE 0.85  CALCIUM 9.2  GFRNONAA >60    Lab Results  Component Value Date   GLUCOSE 99 11/14/2022   NA 135 11/14/2022   K 2.9 (L) 11/14/2022   CL 97 (L) 11/14/2022   CO2 27 11/14/2022   BUN 15 11/14/2022   CREATININE 0.85 11/14/2022   GFRNONAA >60 11/14/2022   CALCIUM 9.2 11/14/2022   PROT 7.4 12/27/2020   ALBUMIN 3.9 12/27/2020   BILITOT 0.6 12/27/2020   ALKPHOS 68 12/27/2020   AST 20 12/27/2020   ALT 17 12/27/2020   ANIONGAP 11 11/14/2022      Lab Results  Component Value Date   ALT 17 12/27/2020   AST 20 12/27/2020   ALKPHOS 68 12/27/2020   BILITOT 0.6 12/27/2020       Latest Ref Rng & Units 11/14/2022    1:02 PM 10/18/2021    8:55 AM 12/27/2020    6:02 PM  CBC   WBC 4.0 - 10.5 K/uL 5.8  5.5  6.5   Hemoglobin 12.0 - 15.0 g/dL 40.9  81.1  91.4   Hematocrit 36.0 - 46.0 % 38.7  39.0  39.9   Platelets 150 - 400 K/uL 278  271  296      External labs:     Radiology:   X-ray 10/18/2021: The heart size and mediastinal contours are within normal limits. Both lungs are clear. The visualized skeletal structures are unremarkable.  Cardiac Studies:   NA  EKG:   EKG 11/14/2022: Normal sinus rhythm at the rate of 64 bpm, normal axis.  Nonspecific ST-T abnormality.    EKG 12/27/2020: Normal sinus rhythm, nonspecific ST-T abnormality.  Surgery Medications and allergies   Allergies  Allergen Reactions   Carafate [Sucralfate] Itching   Macrobid [Nitrofurantoin Macrocrystal]     Unknown reaction   Methimazole Itching   Nsaids    Statins     Myalgia   Sulfa Antibiotics Itching   Tape     Leaves imprint on skin if left on for too long     Medication list   Current Outpatient Medications:    amLODipine (NORVASC) 10 MG tablet, Take 10 mg by mouth daily., Disp: , Rfl:    azelastine (ASTELIN) 0.1 % nasal spray, Place 1 spray into both nostrils in the morning and at bedtime., Disp: , Rfl:    B Complex Vitamins (VITAMIN B COMPLEX) TABS, as directed Orally, Disp: , Rfl:    calcium carbonate (CALCIUM 600) 600 MG TABS tablet, Take 600 mg by mouth 2 (two) times daily with a meal., Disp: , Rfl:    calcium carbonate (TUMS) 500 MG chewable tablet, Chew 2 tablets (400 mg of elemental calcium total) by mouth 3 (three) times daily., Disp: 90 tablet, Rfl: 1   carboxymethylcellulose (REFRESH PLUS) 0.5 % SOLN, Place 1 drop into both eyes in the morning and at bedtime., Disp: , Rfl:    Evolocumab (REPATHA SURECLICK) 140 MG/ML SOAJ, INJECT 140MG  SUBCUTANEOUSLY EVERY 14 DAYS, Disp: 2 mL, Rfl: 6   famotidine (PEPCID) 40 MG tablet, Take 40 mg by mouth every other day., Disp: , Rfl:    irbesartan (AVAPRO) 300 MG tablet, Take 1 tablet (300 mg total) by mouth daily.,  Disp: ,  Rfl:    levothyroxine (SYNTHROID) 88 MCG tablet, Take 1 tablet (88 mcg total) by mouth daily before breakfast., Disp: 30 tablet, Rfl: 2   mometasone (NASONEX) 50 MCG/ACT nasal spray, Place 2 sprays into the nose daily., Disp: 17 g, Rfl: 12   Multiple Vitamins-Minerals (CENTRUM SILVER 50+WOMEN) TABS, Take 1 tablet by mouth daily., Disp: , Rfl:    Probiotic Product (PROBIOTIC-10) CAPS, Take 1 capsule by mouth daily., Disp: , Rfl:    sodium chloride (OCEAN) 0.65 % SOLN nasal spray, Place 1 spray into both nostrils as needed for congestion., Disp: 1 Bottle, Rfl: 0   Vitamin D, Cholecalciferol, 400 units CAPS, Take 400 Units by mouth 2 (two) times daily., Disp: , Rfl:    potassium chloride SA (KLOR-CON M) 20 MEQ tablet, Take 2 tablets (40 mEq total) by mouth daily for 5 days., Disp: 10 tablet, Rfl: 0  Assessment     ICD-10-CM   1. Primary hypertension  I10 irbesartan (AVAPRO) 300 MG tablet    2. Malaise and fatigue  R53.81    R53.83     3. Snoring  R06.83        No orders of the defined types were placed in this encounter.   Meds ordered this encounter  Medications   irbesartan (AVAPRO) 300 MG tablet    Sig: Take 1 tablet (300 mg total) by mouth daily.    Medications Discontinued During This Encounter  Medication Reason   losartan (COZAAR) 100 MG tablet    mineral oil-hydrophilic petrolatum (AQUAPHOR) ointment    levocetirizine (XYZAL) 5 MG tablet    traMADol (ULTRAM) 50 MG tablet    metoprolol succinate (TOPROL-XL) 25 MG 24 hr tablet Discontinued by provider   irbesartan (AVAPRO) 150 MG tablet Dose change     Recommendations:   [name] is a 67 y.o.  Patient with multinodular goiter SP  thyroidectomy August 2023, hypertension, hypercholesterolemia, statin intolerance and is presently on Repatha, seen in the emergency room on 11/14/2022 for fatigue and decreased exercise tolerance ongoing for the past 1 year to year and a half.  She was now referred to me for further  evaluation.  1. Primary hypertension Blood pressure is still uncontrolled.  Advised her to increase Avapro to 300 mg daily from 150 mg.  Her physical examination and EKG do not reveal any significant abnormality and she is asymptomatic and continues to be very active without chest pain or dyspnea.  Suspect her symptoms of fatigue may be related to suspected sleep apnea. - irbesartan (AVAPRO) 300 MG tablet; Take 1 tablet (300 mg total) by mouth daily.  2. Malaise and fatigue I do not think her symptoms are related to symptoms suggestive of CAD.  Her examination is completely normal from vascular standpoint.  She is also on appropriate medical therapy including well-controlled lipids with Repatha.  3. Snoring I suspect she may have sleep apnea.  She has been noted to have loud snoring, and her fatigue could be related to sleep apnea hence she will discuss with her PCP regarding potential for referral for sleep study if indicated.  Otherwise from cardiac standpoint she is stable, I reviewed all her notes from the emergency room visit and updated her chart, she does not need any cardiac evaluation and I will see her back on a as needed basis.  Patient was very pleased with the evaluation.  She also needs to follow-up with her PCP regarding hypertension management, she actually has an appointment coming up very  soon in the next few days.  She is also scheduled for repeat labs.  Other orders - B Complex Vitamins (VITAMIN B COMPLEX) TABS; as directed Orally     Yates Decamp, MD, [facility] 12/02/2022, 4:55 PM Office: [phone]

## 2022-12-01 DIAGNOSIS — Z23 Encounter for immunization: Secondary | ICD-10-CM | POA: Diagnosis not present

## 2022-12-01 DIAGNOSIS — K219 Gastro-esophageal reflux disease without esophagitis: Secondary | ICD-10-CM | POA: Diagnosis not present

## 2022-12-01 DIAGNOSIS — E785 Hyperlipidemia, unspecified: Secondary | ICD-10-CM | POA: Diagnosis not present

## 2022-12-01 DIAGNOSIS — Z780 Asymptomatic menopausal state: Secondary | ICD-10-CM | POA: Diagnosis not present

## 2022-12-01 DIAGNOSIS — E890 Postprocedural hypothyroidism: Secondary | ICD-10-CM | POA: Diagnosis not present

## 2022-12-01 DIAGNOSIS — I700 Atherosclerosis of aorta: Secondary | ICD-10-CM | POA: Diagnosis not present

## 2022-12-01 DIAGNOSIS — I10 Essential (primary) hypertension: Secondary | ICD-10-CM | POA: Diagnosis not present

## 2022-12-06 ENCOUNTER — Other Ambulatory Visit (HOSPITAL_COMMUNITY): Payer: Self-pay

## 2022-12-09 ENCOUNTER — Encounter (HOSPITAL_COMMUNITY): Payer: Self-pay

## 2022-12-12 DIAGNOSIS — I10 Essential (primary) hypertension: Secondary | ICD-10-CM | POA: Diagnosis not present

## 2022-12-12 DIAGNOSIS — Z0000 Encounter for general adult medical examination without abnormal findings: Secondary | ICD-10-CM | POA: Diagnosis not present

## 2022-12-12 DIAGNOSIS — E559 Vitamin D deficiency, unspecified: Secondary | ICD-10-CM | POA: Diagnosis not present

## 2022-12-12 DIAGNOSIS — E890 Postprocedural hypothyroidism: Secondary | ICD-10-CM | POA: Diagnosis not present

## 2022-12-12 DIAGNOSIS — I700 Atherosclerosis of aorta: Secondary | ICD-10-CM | POA: Diagnosis not present

## 2022-12-12 DIAGNOSIS — Z780 Asymptomatic menopausal state: Secondary | ICD-10-CM | POA: Diagnosis not present

## 2022-12-12 DIAGNOSIS — K219 Gastro-esophageal reflux disease without esophagitis: Secondary | ICD-10-CM | POA: Diagnosis not present

## 2022-12-12 DIAGNOSIS — E785 Hyperlipidemia, unspecified: Secondary | ICD-10-CM | POA: Diagnosis not present

## 2022-12-13 LAB — LAB REPORT - SCANNED: EGFR: 75

## 2022-12-18 ENCOUNTER — Other Ambulatory Visit: Payer: Self-pay

## 2022-12-19 ENCOUNTER — Other Ambulatory Visit (HOSPITAL_COMMUNITY): Payer: Self-pay

## 2022-12-19 DIAGNOSIS — E890 Postprocedural hypothyroidism: Secondary | ICD-10-CM | POA: Diagnosis not present

## 2022-12-19 DIAGNOSIS — K219 Gastro-esophageal reflux disease without esophagitis: Secondary | ICD-10-CM | POA: Diagnosis not present

## 2022-12-19 DIAGNOSIS — Z0000 Encounter for general adult medical examination without abnormal findings: Secondary | ICD-10-CM | POA: Diagnosis not present

## 2022-12-19 DIAGNOSIS — E785 Hyperlipidemia, unspecified: Secondary | ICD-10-CM | POA: Diagnosis not present

## 2022-12-19 DIAGNOSIS — N182 Chronic kidney disease, stage 2 (mild): Secondary | ICD-10-CM | POA: Diagnosis not present

## 2022-12-19 DIAGNOSIS — Z23 Encounter for immunization: Secondary | ICD-10-CM | POA: Diagnosis not present

## 2022-12-19 DIAGNOSIS — I10 Essential (primary) hypertension: Secondary | ICD-10-CM | POA: Diagnosis not present

## 2022-12-19 DIAGNOSIS — I700 Atherosclerosis of aorta: Secondary | ICD-10-CM | POA: Diagnosis not present

## 2022-12-19 DIAGNOSIS — Z780 Asymptomatic menopausal state: Secondary | ICD-10-CM | POA: Diagnosis not present

## 2022-12-29 DIAGNOSIS — H43391 Other vitreous opacities, right eye: Secondary | ICD-10-CM | POA: Diagnosis not present

## 2022-12-29 DIAGNOSIS — H2511 Age-related nuclear cataract, right eye: Secondary | ICD-10-CM | POA: Diagnosis not present

## 2022-12-29 DIAGNOSIS — H43392 Other vitreous opacities, left eye: Secondary | ICD-10-CM | POA: Diagnosis not present

## 2022-12-29 DIAGNOSIS — H43393 Other vitreous opacities, bilateral: Secondary | ICD-10-CM | POA: Diagnosis not present

## 2022-12-29 DIAGNOSIS — H43812 Vitreous degeneration, left eye: Secondary | ICD-10-CM | POA: Diagnosis not present

## 2022-12-29 DIAGNOSIS — H33301 Unspecified retinal break, right eye: Secondary | ICD-10-CM | POA: Diagnosis not present

## 2022-12-29 DIAGNOSIS — H35341 Macular cyst, hole, or pseudohole, right eye: Secondary | ICD-10-CM | POA: Diagnosis not present

## 2022-12-29 DIAGNOSIS — H33311 Horseshoe tear of retina without detachment, right eye: Secondary | ICD-10-CM | POA: Diagnosis not present

## 2023-01-05 ENCOUNTER — Other Ambulatory Visit: Payer: Self-pay

## 2023-01-09 ENCOUNTER — Other Ambulatory Visit: Payer: Self-pay

## 2023-01-09 NOTE — Progress Notes (Signed)
Specialty Pharmacy Refill Coordination Note  [name] is a 67 y.o. female contacted today regarding refills of specialty medication(s) Evolocumab   Patient requested [name] at [facility] at [place] date: 01/17/23   Medication will be filled on 01/16/23.

## 2023-01-10 DIAGNOSIS — I739 Peripheral vascular disease, unspecified: Secondary | ICD-10-CM | POA: Diagnosis not present

## 2023-01-10 DIAGNOSIS — L603 Nail dystrophy: Secondary | ICD-10-CM | POA: Diagnosis not present

## 2023-01-10 DIAGNOSIS — L84 Corns and callosities: Secondary | ICD-10-CM | POA: Diagnosis not present

## 2023-01-17 DIAGNOSIS — Z01419 Encounter for gynecological examination (general) (routine) without abnormal findings: Secondary | ICD-10-CM | POA: Diagnosis not present

## 2023-01-30 NOTE — Progress Notes (Signed)
Cardiology Clinic Note   Patient Name: [name] Date of Encounter: 02/02/2023  Primary Care Provider:  Georgianne Fick, MD Primary Cardiologist:  None  Patient Profile    [name] 67 year old female presents the clinic today for follow-up evaluation of her essential hypertension and hyperlipidemia.  Past Medical History    Past Medical History:  Diagnosis Date   AC (acromioclavicular) joint bone spurs, unspecified laterality    Arthritis    Chronic low back pain    GERD (gastroesophageal reflux disease)    Hyperlipemia    Hypertension    Urine incontinence    Past Surgical History:  Procedure Laterality Date   ABDOMINAL HYSTERECTOMY  2003   CARPAL TUNNEL RELEASE     HAMMER TOE SURGERY     HERNIA REPAIR     SHOULDER SURGERY     THYROIDECTOMY N/A 10/21/2021   Procedure: TOTAL THYROIDECTOMY;  Surgeon: Darnell Level, MD;  Location: [facility];  Service: General;  Laterality: N/A;   WISDOM TOOTH EXTRACTION      Allergies  Allergies  Allergen Reactions   Carafate [Sucralfate] Itching   Macrobid [Nitrofurantoin Macrocrystal]     Unknown reaction   Methimazole Itching   Nsaids    Statins     Myalgia   Sulfa Antibiotics Itching   Tape     Leaves imprint on skin if left on for too long     History of Present Illness    [name] has a PMH of multinodular goiter status post thyroidectomy 8/23, HLD, HTN, statin intolerance and is on Repatha.  She was seen in the emergency department on 11/14/2022.  She presented with fatigue and decreased exercise tolerance.  This has been ongoing x 1 year and a half.  Cardiology was consulted for further evaluation.  She was seen by Dr. Jacinto Halim on 11/29/2022.  During that time she was concerned about coronary artery disease and wished to proceed with further evaluation.  She continued to be active.  She denied shortness of breath, chest discomfort, irregular heartbeats, dizziness, and syncope.  She did note  ongoing fatigue for several years over the past 2 to 3 years.  She reported that it had been more pronounced over the last year.  Her blood pressure was uncontrolled.  She was started on irbesartan.  It was not felt that her symptoms were related to coronary disease.  It was felt that she was normal from a vascular standpoint.  She was continued on  Repatha.  Sleep apnea was suspected.  She was instructed to follow-up with her PCP for referral for sleep study.  She presents to the clinic today for follow-up evaluation and states her fatigue is somewhat better after being transitioned to "short acting metoprolol".  She reports that she has been taking 25 mg daily.  She has returned to walking 3 times per week and line dancing 1 hour on Fridays.  Her blood pressure log shows well-controlled blood pressures and heart rates in the 50s-60s.  Her blood pressure today is 134/82.  We reviewed her recommendation for sleep study.  She reports that she reviewed this with Dr. Nicholos Johns and will defer sleep study at this time.  I will give her sleep hygiene instructions, have her maintain a blood pressure log, maintain her physical activity and plan follow-up in 6 to 9 months.  I will request her lipid panel from Dr. Nicholos Johns and have her verify her metoprolol.  Today she denies chest pain, shortness  of breath, lower extremity edema, fatigue, palpitations, melena, hematuria, hemoptysis, diaphoresis, weakness, presyncope, syncope, orthopnea, and PND.    Home Medications    Prior to Admission medications   Medication Sig Start Date End Date Taking? Authorizing Provider  amLODipine (NORVASC) 10 MG tablet Take 10 mg by mouth daily.    [provider]  azelastine (ASTELIN) 0.1 % nasal spray Place 1 spray into both nostrils in the morning and at bedtime. 08/07/21   [provider]  B Complex Vitamins (VITAMIN B COMPLEX) TABS as directed Orally    [provider]  calcium carbonate  (CALCIUM 600) 600 MG TABS tablet Take 600 mg by mouth 2 (two) times daily with a meal.    [provider]  calcium carbonate (TUMS) 500 MG chewable tablet Chew 2 tablets (400 mg of elemental calcium total) by mouth 3 (three) times daily. 10/21/21   Darnell Level, MD  carboxymethylcellulose (REFRESH PLUS) 0.5 % SOLN Place 1 drop into both eyes in the morning and at bedtime.    [provider]  Evolocumab (REPATHA SURECLICK) 140 MG/ML SOAJ INJECT 140MG  SUBCUTANEOUSLY EVERY 14 DAYS 11/14/22     famotidine (PEPCID) 40 MG tablet Take 40 mg by mouth every other day.    [provider]  irbesartan (AVAPRO) 300 MG tablet Take 1 tablet (300 mg total) by mouth daily. 11/29/22   Yates Decamp, MD  levothyroxine (SYNTHROID) 88 MCG tablet Take 1 tablet (88 mcg total) by mouth daily before breakfast. 10/21/21 11/29/22  Darnell Level, MD  mometasone (NASONEX) 50 MCG/ACT nasal spray Place 2 sprays into the nose daily. 04/12/16   Dohmeier, Porfirio Mylar, MD  Multiple Vitamins-Minerals (CENTRUM SILVER 50+WOMEN) TABS Take 1 tablet by mouth daily.    [provider]  potassium chloride SA (KLOR-CON M) 20 MEQ tablet Take 2 tablets (40 mEq total) by mouth daily for 5 days. 11/14/22 11/19/22  Marita Kansas, PA-C  Probiotic Product (PROBIOTIC-10) CAPS Take 1 capsule by mouth daily.    [provider]  sodium chloride (OCEAN) 0.65 % SOLN nasal spray Place 1 spray into both nostrils as needed for congestion. 04/12/16   Dohmeier, Porfirio Mylar, MD  Vitamin D, Cholecalciferol, 400 units CAPS Take 400 Units by mouth 2 (two) times daily.    [provider]    Family History    Family History  Problem Relation Age of Onset   Diabetes Sister    Hypertension Brother    Alzheimer's disease Mother    Prostate cancer Father    Colon cancer Father    She indicated that her mother is deceased. She indicated that her father is deceased. She indicated that the status of her sister is unknown. She indicated that  the status of her brother is unknown.  Social History    Social History   Socioeconomic History   Marital status: Widowed    Spouse name: Not on file   Number of children: Not on file   Years of education: Not on file   Highest education level: Not on file  Occupational History   Not on file  Tobacco Use   Smoking status: Never   Smokeless tobacco: Never  Vaping Use   Vaping status: Never Used  Substance and Sexual Activity   Alcohol use: Not Currently    Alcohol/week: 1.0 standard drink of alcohol    Types: 1 Standard drinks or equivalent per week    Comment: rare   Drug use: No   Sexual activity: Not Currently  Other Topics Concern   Not on file  Social History Narrative   Not on file   Social Determinants of Health   Financial Resource Strain: Not on file  Food Insecurity: Not on file  Transportation Needs: Not on file  Physical Activity: Not on file  Stress: Not on file  Social Connections: Not on file  Intimate Partner Violence: Not on file     Review of Systems    General:  No chills, fever, night sweats or weight changes.  Cardiovascular:  No chest pain, dyspnea on exertion, edema, orthopnea, palpitations, paroxysmal nocturnal dyspnea. Dermatological: No rash, lesions/masses Respiratory: No cough, dyspnea Urologic: No hematuria, dysuria Abdominal:   No nausea, vomiting, diarrhea, bright red blood per rectum, melena, or hematemesis Neurologic:  No visual changes, wkns, changes in mental status. All other systems reviewed and are otherwise negative except as noted above.  Physical Exam    VS:  BP 134/82 (BP Location: Left Arm, Patient Position: Sitting, Cuff Size: Normal)   Pulse 66   Ht 5' 6.5" (1.689 m)   Wt 178 lb (80.7 kg)   BMI 28.30 kg/m  , BMI Body mass index is 28.3 kg/m. GEN: Well nourished, well developed, in no acute distress. HEENT: normal. Neck: Supple, no JVD, carotid bruits, or masses. Cardiac: RRR, no murmurs, rubs, or gallops. No  clubbing, cyanosis, edema.  Radials/DP/PT 2+ and equal bilaterally.  Respiratory:  Respirations regular and unlabored, clear to auscultation bilaterally. GI: Soft, nontender, nondistended, BS + x 4. MS: no deformity or atrophy. Skin: warm and dry, no rash. Neuro:  Strength and sensation are intact. Psych: Normal affect.  Accessory Clinical Findings    Recent Labs: 11/14/2022: BUN 15; Creatinine, Ser 0.85; Hemoglobin 12.5; Platelets 278; Potassium 2.9; Sodium 135   Recent Lipid Panel No results found for: "CHOL", "TRIG", "HDL", "CHOLHDL", "VLDL", "LDLCALC", "LDLDIRECT"       ECG personally reviewed by me today-none today.     Echocardiogram 06/08/2006  LEFT VENTRICLE:   -  Left ventricular size was normal.   -  Overall left ventricular systolic function was normal.   -  Left ventricular ejection fraction was estimated to be 60 %.   -  There were no left ventricular regional wall motion         abnormalities.   -  Left ventricular wall thickness was normal.   AORTIC VALVE:   -  The aortic valve was trileaflet.   -  Aortic valve thickness was normal.   -  There was normal aortic valve leaflet excursion.    Doppler interpretation(s):   -  Transaortic velocity was within the normal range.   -  There was no evidence for aortic valve stenosis.   -  There was no significant aortic valvular regurgitation.   AORTA:   -  The aortic root was normal in size.   MITRAL VALVE:   -  There was mild mitral annular calcification.    Doppler interpretation(s):   -  There was no evidence for mitral stenosis.   -  There was no significant mitral valvular regurgitation.   LEFT ATRIUM:   -  Left atrial size was normal.   RIGHT VENTRICLE:   -  Right ventricular size was normal.   -  Right ventricular systolic function was normal.   -  Right ventricular wall thickness was normal.   PULMONIC VALVE:   -  The pulmonic valve was grossly normal.    Doppler interpretation(s):   -  There was no  pulmonic valve stenosis.   -  There was no significant pulmonic regurgitation.   TRICUSPID VALVE:   -  The tricuspid valve structure was normal.   -  Tricuspid leaflet excursion was normal.    Doppler interpretation(s):   -  The transtricuspid velocity was within the normal range.   -  There was no evidence for tricuspid stenosis.   -  There was trivial tricuspid valvular regurgitation.   RIGHT ATRIUM:   -  Right atrial size was normal.   SYSTEMIC VEINS:   -  The inferior vena cava was normal.   PERICARDIUM:   -  There was no pericardial effusion.    ---------------------------------------------------------------   SUMMARY   -  Overall left ventricular systolic function was normal. Left         ventricular ejection fraction was estimated to be 60 %. There         were no left ventricular regional wall motion abnormalities.   -  There was mild mitral annular calcification.    ---------------------------------------------------------------   Prepared and Electronically Authenticated by   Nicholes Mango MD   Confirmed 08-Jun-2006 18:59:23      Assessment & Plan   1.  Essential hypertension-BP today 134/82. Maintain blood pressure log Continue irbesartan, amlodipine, metoprolol (call clinic to verify dosing and medication) Heart healthy low-sodium diet  Fatigue-chronic.  Has been present for greater than 3 years.  Denies chest pain and shortness of breath.  Appears to be related to possible sleep apnea. No plans for ischemic evaluation  Daytime somnolence-fatigue better with changing metoprolol. Spoke with PCP and wishes to defer sleep study. Avoid supine sleeping Elevate head of bed Sleep hygiene  Hyperlipidemia-  Statin intolerant Compliant with Repatha Follows with PCP High-fiber diet Request lipid panel from PCP  Disposition: Follow-up with Dr. Jacinto Halim or me in 6-9 months.   Thomasene Ripple. Danice Dippolito NP-C     02/02/2023, 8:22 AM [facility] [address] Office [phone] Fax [phone]    I spent 14 minutes examining this patient, reviewing medications, and using patient centered shared decision making involving her cardiac care.   I spent greater than 20 minutes reviewing her past medical history,  medications, and prior cardiac tests.

## 2023-01-31 DIAGNOSIS — H2511 Age-related nuclear cataract, right eye: Secondary | ICD-10-CM | POA: Diagnosis not present

## 2023-01-31 DIAGNOSIS — H43392 Other vitreous opacities, left eye: Secondary | ICD-10-CM | POA: Diagnosis not present

## 2023-01-31 DIAGNOSIS — H35341 Macular cyst, hole, or pseudohole, right eye: Secondary | ICD-10-CM | POA: Diagnosis not present

## 2023-01-31 DIAGNOSIS — H43812 Vitreous degeneration, left eye: Secondary | ICD-10-CM | POA: Diagnosis not present

## 2023-01-31 DIAGNOSIS — H43391 Other vitreous opacities, right eye: Secondary | ICD-10-CM | POA: Diagnosis not present

## 2023-01-31 DIAGNOSIS — H31091 Other chorioretinal scars, right eye: Secondary | ICD-10-CM | POA: Diagnosis not present

## 2023-02-02 ENCOUNTER — Encounter: Payer: Self-pay | Admitting: General Practice

## 2023-02-02 ENCOUNTER — Ambulatory Visit: Payer: Medicare HMO | Attending: Cardiology | Admitting: General Practice

## 2023-02-02 VITALS — BP 134/82 | HR 66 | Ht 66.5 in | Wt 178.0 lb

## 2023-02-02 DIAGNOSIS — R0683 Snoring: Secondary | ICD-10-CM | POA: Diagnosis not present

## 2023-02-02 DIAGNOSIS — I10 Essential (primary) hypertension: Secondary | ICD-10-CM | POA: Diagnosis not present

## 2023-02-02 DIAGNOSIS — R5383 Other fatigue: Secondary | ICD-10-CM | POA: Diagnosis not present

## 2023-02-02 DIAGNOSIS — R5381 Other malaise: Secondary | ICD-10-CM

## 2023-02-02 DIAGNOSIS — E782 Mixed hyperlipidemia: Secondary | ICD-10-CM | POA: Diagnosis not present

## 2023-02-02 NOTE — Patient Instructions (Signed)
Medication Instructions:  No changes *If you need a refill on your cardiac medications before your next appointment, please call your pharmacy*   Exercising to Stay Healthy To become healthy and stay healthy, it is recommended that you do moderate-intensity and vigorous-intensity exercise. You can tell that you are exercising at a moderate intensity if your heart starts beating faster and you start breathing faster but can still hold a conversation. You can tell that you are exercising at a vigorous intensity if you are breathing much harder and faster and cannot hold a conversation while exercising. How can exercise benefit me? Exercising regularly is important. It has many health benefits, such as: Improving overall fitness, flexibility, and endurance. Increasing bone density. Helping with weight control. Decreasing body fat. Increasing muscle strength and endurance. Reducing stress and tension, anxiety, depression, or anger. Improving overall health. What guidelines should I follow while exercising? Before you start a new exercise program, talk with your health care provider. Do not exercise so much that you hurt yourself, feel dizzy, or get very short of breath. Wear comfortable clothes and wear shoes with good support. Drink plenty of water while you exercise to prevent dehydration or heat stroke. Work out until your breathing and your heartbeat get faster (moderate intensity). How often should I exercise? Choose an activity that you enjoy, and set realistic goals. Your health care provider can help you make an activity plan that is individually designed and works best for you. Exercise regularly as told by your health care provider. This may include: Doing strength training two times a week, such as: Lifting weights. Using resistance bands. Push-ups. Sit-ups. Yoga. Doing a certain intensity of exercise for a given amount of time. Choose from these options: A total of 150 minutes  of moderate-intensity exercise every week. A total of 75 minutes of vigorous-intensity exercise every week. A mix of moderate-intensity and vigorous-intensity exercise every week. Children, pregnant women, people who have not exercised regularly, people who are overweight, and older adults may need to talk with a health care provider about what activities are safe to perform. If you have a medical condition, be sure to talk with your health care provider before you start a new exercise program. What are some exercise ideas? Moderate-intensity exercise ideas include: Walking 1 mile (1.6 km) in about 15 minutes. Biking. Hiking. Golfing. Dancing. Water aerobics. Vigorous-intensity exercise ideas include: Walking 4.5 miles (7.2 km) or more in about 1 hour. Jogging or running 5 miles (8 km) in about 1 hour. Biking 10 miles (16.1 km) or more in about 1 hour. Lap swimming. Roller-skating or in-line skating. Cross-country skiing. Vigorous competitive sports, such as football, basketball, and soccer. Jumping rope. Aerobic dancing. What are some everyday activities that can help me get exercise? Yard work, such as: Child psychotherapist. Raking and bagging leaves. Washing your car. Pushing a stroller. Shoveling snow. Gardening. Washing windows or floors. How can I be more active in my day-to-day activities? Use stairs instead of an elevator. Take a walk during your lunch break. If you drive, park your car farther away from your work or school. If you take public transportation, get off one stop early and walk the rest of the way. Stand up or walk around during all of your indoor phone calls. Get up, stretch, and walk around every 30 minutes throughout the day. Enjoy exercise with a friend. Support to continue exercising will help you keep a regular routine of activity. Where to find more information You  can find more information about exercising to stay healthy from: U.S. Department of  Health and Human Services: ThisPath.fi Centers for Disease Control and Prevention (CDC): FootballExhibition.com.br Summary Exercising regularly is important. It will improve your overall fitness, flexibility, and endurance. Regular exercise will also improve your overall health. It can help you control your weight, reduce stress, and improve your bone density. Do not exercise so much that you hurt yourself, feel dizzy, or get very short of breath. Before you start a new exercise program, talk with your health care provider. This information is not intended to replace advice given to you by your health care provider. Make sure you discuss any questions you have with your health care provider. Document Revised: 07/02/2020 Document Reviewed: 07/02/2020 Elsevier Patient Education  2024 Elsevier Inc.   Quality Sleep Information, Adult Quality sleep is important for your mental and physical health. It also improves your quality of life. Quality sleep means you: Are asleep for most of the time you are in bed. Fall asleep within 30 minutes. Wake up no more than once a night. Are awake for no longer than 20 minutes if you do wake up during the night. Most adults need 7-8 hours of quality sleep each night. How can poor sleep affect me? If you do not get enough quality sleep, you may have: Mood swings. Daytime sleepiness. Decreased alertness, reaction time, and concentration. Sleep disorders, such as insomnia and sleep apnea. Difficulty with: Solving problems. Coping with stress. Paying attention. These issues may affect your performance and productivity at work, school, and home. Lack of sleep may also put you at higher risk for accidents, suicide, and risky behaviors. If you do not get quality sleep, you may also be at higher risk for several health problems, including: Infections. Type 2 diabetes. Heart disease. High blood pressure. Obesity. Worsening of long-term conditions, like arthritis, kidney  disease, depression, Parkinson's disease, and epilepsy. What actions can I take to get more quality sleep? Sleep schedule and routine Stick to a sleep schedule. Go to sleep and wake up at about the same time each day. Do not try to sleep less on weekdays and make up for lost sleep on weekends. This does not work. Limit naps during the day to 30 minutes or less. Do not take naps in the late afternoon. Make time to relax before bed. Reading, listening to music, or taking a hot bath promotes quality sleep. Make your bedroom a place that promotes quality sleep. Keep your bedroom dark, quiet, and at a comfortable room temperature. Make sure your bed is comfortable. Avoid using electronic devices that give off bright blue light for 30 minutes before bedtime. Your brain perceives bright blue light as sunlight. This includes television, phones, and computers. If you are lying awake in bed for longer than 20 minutes, get up and do a relaxing activity until you feel sleepy. Lifestyle     Try to get at least 30 minutes of exercise on most days. Do not exercise 2-3 hours before going to bed. Do not use any products that contain nicotine or tobacco. These products include cigarettes, chewing tobacco, and vaping devices, such as e-cigarettes. If you need help quitting, ask your health care provider. Do not drink caffeinated beverages for at least 8 hours before going to bed. Coffee, tea, and some sodas contain caffeine. Do not drink alcohol or eat large meals close to bedtime. Try to get at least 30 minutes of sunlight every day. Morning sunlight is best. Medical  concerns Work with your health care provider to treat medical conditions that may affect sleeping, such as: Nasal obstruction. Snoring. Sleep apnea and other sleep disorders. Talk to your health care provider if you think any of your prescription medicines may cause you to have difficulty falling or staying asleep. If you have sleep problems, talk  with a sleep consultant. If you think you have a sleep disorder, talk with your health care provider about getting evaluated by a specialist. Where to find more information Sleep Foundation: sleepfoundation.org American Academy of Sleep Medicine: aasm.org Centers for Disease Control and Prevention (CDC): TonerPromos.no Contact a health care provider if: You have trouble getting to sleep or staying asleep. You often wake up very early in the morning and cannot get back to sleep. You have daytime sleepiness. You have daytime sleep attacks of suddenly falling asleep and sudden muscle weakness (narcolepsy). You have a tingling sensation in your legs with a strong urge to move your legs (restless legs syndrome). You stop breathing briefly during sleep (sleep apnea). You think you have a sleep disorder or are taking a medicine that is affecting your quality of sleep. Summary Most adults need 7-8 hours of quality sleep each night. Getting enough quality sleep is important for your mental and physical health. Make your bedroom a place that promotes quality sleep, and avoid things that may cause you to have poor sleep, such as alcohol, caffeine, smoking, or large meals. Talk to your health care provider if you have trouble falling asleep or staying asleep. This information is not intended to replace advice given to you by your health care provider. Make sure you discuss any questions you have with your health care provider. Document Revised: 06/29/2021 Document Reviewed: 06/29/2021 Elsevier Patient Education  2024 Elsevier Inc.    Follow-Up: At [facility], you and your health needs are our priority.  As part of our continuing mission to provide you with exceptional heart care, we have created designated Provider Care Teams.  These Care Teams include your primary Cardiologist (physician) and Advanced Practice Providers (APPs -  Physician Assistants and Nurse Practitioners) who all work together  to provide you with the care you need, when you need it.  We recommend signing up for the patient portal called "MyChart".  Sign up information is provided on this After Visit Summary.  MyChart is used to connect with patients for Virtual Visits (Telemedicine).  Patients are able to view lab/test results, encounter notes, upcoming appointments, etc.  Non-urgent messages can be sent to your provider as well.   To learn more about what you can do with MyChart, go to [email or web address].    Your next appointment:    6-9 months  Provider:   Oris Drone ARNP   Other Instructions Salty six handout given.

## 2023-02-05 ENCOUNTER — Other Ambulatory Visit: Payer: Self-pay

## 2023-02-05 NOTE — Progress Notes (Signed)
Specialty Pharmacy Refill Coordination Note  [name] is a 67 y.o. female contacted today regarding refills of specialty medication(s) Evolocumab   Patient requested [name] at [facility] [place] date: 02/12/23   Medication will be filled on 02/09/23.

## 2023-02-09 ENCOUNTER — Other Ambulatory Visit: Payer: Self-pay

## 2023-02-13 ENCOUNTER — Other Ambulatory Visit (HOSPITAL_COMMUNITY): Payer: Self-pay

## 2023-02-21 ENCOUNTER — Ambulatory Visit: Payer: Medicare HMO | Admitting: Cardiology

## 2023-03-06 ENCOUNTER — Other Ambulatory Visit: Payer: Self-pay

## 2023-03-06 ENCOUNTER — Other Ambulatory Visit (HOSPITAL_COMMUNITY): Payer: Self-pay

## 2023-03-06 NOTE — Progress Notes (Signed)
Specialty Pharmacy Refill Coordination Note  [name] is a 67 y.o. female contacted today regarding refills of specialty medication(s) Evolocumab (Repatha SureClick)   Patient requested [name] at [facility] at [place] date: 03/07/23   Medication will be filled on 03/06/23.

## 2023-03-08 ENCOUNTER — Other Ambulatory Visit (HOSPITAL_COMMUNITY): Payer: Self-pay

## 2023-03-28 ENCOUNTER — Other Ambulatory Visit: Payer: Self-pay | Admitting: Obstetrics and Gynecology

## 2023-03-28 DIAGNOSIS — Z1231 Encounter for screening mammogram for malignant neoplasm of breast: Secondary | ICD-10-CM

## 2023-04-03 ENCOUNTER — Other Ambulatory Visit: Payer: Self-pay

## 2023-04-04 ENCOUNTER — Other Ambulatory Visit: Payer: Self-pay

## 2023-04-04 NOTE — Progress Notes (Signed)
 Specialty Pharmacy Refill Coordination Note  [name] is a 68 y.o. female contacted today regarding refills of specialty medication(s) Evolocumab  (Repatha  SureClick)   Patient requested Delivery   Delivery date: 04/06/23   Verified address: [address] [place] [address]   Medication will be filled on 04/05/23.

## 2023-04-05 ENCOUNTER — Other Ambulatory Visit: Payer: Self-pay

## 2023-04-12 DIAGNOSIS — L603 Nail dystrophy: Secondary | ICD-10-CM | POA: Diagnosis not present

## 2023-04-12 DIAGNOSIS — L84 Corns and callosities: Secondary | ICD-10-CM | POA: Diagnosis not present

## 2023-04-12 DIAGNOSIS — I739 Peripheral vascular disease, unspecified: Secondary | ICD-10-CM | POA: Diagnosis not present

## 2023-04-23 DIAGNOSIS — H43813 Vitreous degeneration, bilateral: Secondary | ICD-10-CM | POA: Diagnosis not present

## 2023-04-23 DIAGNOSIS — H04123 Dry eye syndrome of bilateral lacrimal glands: Secondary | ICD-10-CM | POA: Diagnosis not present

## 2023-04-24 ENCOUNTER — Other Ambulatory Visit: Payer: Self-pay

## 2023-04-24 NOTE — Progress Notes (Signed)
 Specialty Pharmacy Refill Coordination Note  [name] is a 68 y.o. female contacted today regarding refills of specialty medication(s) Evolocumab  (Repatha  SureClick)   Patient requested Delivery   Delivery date: 05/03/23   Verified address: [address] [name] [address]   Medication will be filled on 05/02/23.

## 2023-04-25 ENCOUNTER — Other Ambulatory Visit: Payer: Self-pay

## 2023-05-02 ENCOUNTER — Other Ambulatory Visit: Payer: Self-pay

## 2023-05-11 ENCOUNTER — Ambulatory Visit: Payer: Medicare HMO

## 2023-05-29 ENCOUNTER — Other Ambulatory Visit: Payer: Self-pay

## 2023-05-31 ENCOUNTER — Other Ambulatory Visit: Payer: Self-pay

## 2023-05-31 MED ORDER — REPATHA SURECLICK 140 MG/ML ~~LOC~~ SOAJ
140.0000 mg | SUBCUTANEOUS | 6 refills | Status: DC
  Filled 2023-05-31: qty 2, 28d supply, fill #0
  Filled 2023-06-28: qty 2, 28d supply, fill #1
  Filled 2023-07-27: qty 2, 28d supply, fill #2
  Filled 2023-08-24: qty 2, 28d supply, fill #3
  Filled 2023-09-19: qty 2, 28d supply, fill #4
  Filled 2023-10-12: qty 2, 28d supply, fill #5
  Filled 2023-11-15: qty 2, 28d supply, fill #6

## 2023-05-31 NOTE — Progress Notes (Signed)
 Specialty Pharmacy Refill Coordination Note  [name] is a 68 y.o. female contacted today regarding refills of specialty medication(s) Evolocumab (Repatha SureClick)   Patient requested Delivery   Delivery date: 06/06/23   Verified address: [address] [place] [address]   Medication will be filled on 06/05/23, pending refill approval.   This fill date is pending response to refill request from provider. Patient is aware and if they have not received fill by intended date they must follow up with pharmacy.

## 2023-06-05 ENCOUNTER — Other Ambulatory Visit: Payer: Self-pay

## 2023-06-05 ENCOUNTER — Ambulatory Visit
Admission: RE | Admit: 2023-06-05 | Discharge: 2023-06-05 | Disposition: A | Discharge status: Home / Self Care | Payer: Medicare HMO | Source: Ambulatory Visit | Attending: Obstetrics and Gynecology | Admitting: Obstetrics and Gynecology

## 2023-06-05 DIAGNOSIS — Z1231 Encounter for screening mammogram for malignant neoplasm of breast: Secondary | ICD-10-CM | POA: Diagnosis not present

## 2023-06-14 DIAGNOSIS — H43811 Vitreous degeneration, right eye: Secondary | ICD-10-CM | POA: Diagnosis not present

## 2023-06-27 ENCOUNTER — Other Ambulatory Visit: Payer: Self-pay

## 2023-06-28 ENCOUNTER — Other Ambulatory Visit: Payer: Self-pay | Admitting: Pharmacy Technician

## 2023-06-28 ENCOUNTER — Other Ambulatory Visit: Payer: Self-pay

## 2023-06-28 NOTE — Progress Notes (Signed)
 Specialty Pharmacy Refill Coordination Note  [name] is a 68 y.o. female contacted today regarding refills of specialty medication(s) Evolocumab (Repatha SureClick)   Patient requested [name] at [facility] [place] date: 07/02/23   Medication will be filled on 06/29/23.   Patient will pickup medication after another MD appointment.

## 2023-07-02 DIAGNOSIS — N649 Disorder of breast, unspecified: Secondary | ICD-10-CM | POA: Diagnosis not present

## 2023-07-03 DIAGNOSIS — I700 Atherosclerosis of aorta: Secondary | ICD-10-CM | POA: Diagnosis not present

## 2023-07-03 DIAGNOSIS — K219 Gastro-esophageal reflux disease without esophagitis: Secondary | ICD-10-CM | POA: Diagnosis not present

## 2023-07-03 DIAGNOSIS — E785 Hyperlipidemia, unspecified: Secondary | ICD-10-CM | POA: Diagnosis not present

## 2023-07-03 DIAGNOSIS — G609 Hereditary and idiopathic neuropathy, unspecified: Secondary | ICD-10-CM | POA: Diagnosis not present

## 2023-07-03 DIAGNOSIS — N182 Chronic kidney disease, stage 2 (mild): Secondary | ICD-10-CM | POA: Diagnosis not present

## 2023-07-03 DIAGNOSIS — E890 Postprocedural hypothyroidism: Secondary | ICD-10-CM | POA: Diagnosis not present

## 2023-07-03 DIAGNOSIS — I10 Essential (primary) hypertension: Secondary | ICD-10-CM | POA: Diagnosis not present

## 2023-07-10 DIAGNOSIS — N182 Chronic kidney disease, stage 2 (mild): Secondary | ICD-10-CM | POA: Diagnosis not present

## 2023-07-10 DIAGNOSIS — K219 Gastro-esophageal reflux disease without esophagitis: Secondary | ICD-10-CM | POA: Diagnosis not present

## 2023-07-10 DIAGNOSIS — E785 Hyperlipidemia, unspecified: Secondary | ICD-10-CM | POA: Diagnosis not present

## 2023-07-10 DIAGNOSIS — I10 Essential (primary) hypertension: Secondary | ICD-10-CM | POA: Diagnosis not present

## 2023-07-10 DIAGNOSIS — I700 Atherosclerosis of aorta: Secondary | ICD-10-CM | POA: Diagnosis not present

## 2023-07-10 DIAGNOSIS — E890 Postprocedural hypothyroidism: Secondary | ICD-10-CM | POA: Diagnosis not present

## 2023-07-17 ENCOUNTER — Other Ambulatory Visit: Payer: Self-pay

## 2023-07-20 ENCOUNTER — Other Ambulatory Visit (HOSPITAL_COMMUNITY): Payer: Self-pay

## 2023-07-24 DIAGNOSIS — R350 Frequency of micturition: Secondary | ICD-10-CM | POA: Diagnosis not present

## 2023-07-24 DIAGNOSIS — N9089 Other specified noninflammatory disorders of vulva and perineum: Secondary | ICD-10-CM | POA: Diagnosis not present

## 2023-07-27 ENCOUNTER — Other Ambulatory Visit: Payer: Self-pay

## 2023-07-27 NOTE — Progress Notes (Signed)
 Specialty Pharmacy Refill Coordination Note  [name] is a 68 y.o. female contacted today regarding refills of specialty medication(s) Evolocumab  (Repatha  SureClick)   Patient requested Delivery   Delivery date: 08/01/23   Verified address: [address] [place]  [place]  [address]   Medication will be filled on 07/31/23.

## 2023-07-31 ENCOUNTER — Other Ambulatory Visit: Payer: Self-pay

## 2023-08-09 DIAGNOSIS — J300 Vasomotor rhinitis: Secondary | ICD-10-CM | POA: Diagnosis not present

## 2023-08-16 DIAGNOSIS — H43811 Vitreous degeneration, right eye: Secondary | ICD-10-CM | POA: Diagnosis not present

## 2023-08-16 DIAGNOSIS — H04123 Dry eye syndrome of bilateral lacrimal glands: Secondary | ICD-10-CM | POA: Diagnosis not present

## 2023-08-16 DIAGNOSIS — H5213 Myopia, bilateral: Secondary | ICD-10-CM | POA: Diagnosis not present

## 2023-08-16 DIAGNOSIS — H33301 Unspecified retinal break, right eye: Secondary | ICD-10-CM | POA: Diagnosis not present

## 2023-08-24 ENCOUNTER — Other Ambulatory Visit: Payer: Self-pay

## 2023-08-24 DIAGNOSIS — H04123 Dry eye syndrome of bilateral lacrimal glands: Secondary | ICD-10-CM | POA: Diagnosis not present

## 2023-08-24 DIAGNOSIS — H2511 Age-related nuclear cataract, right eye: Secondary | ICD-10-CM | POA: Diagnosis not present

## 2023-08-24 NOTE — Progress Notes (Signed)
 Specialty Pharmacy Refill Coordination Note  [name] is a 68 y.o. female contacted today regarding refills of specialty medication(s) Evolocumab  (Repatha  SureClick)   Patient requested Delivery   Delivery date: 08/29/23   Verified address: [address] [place]  [place] [address]   Medication will be filled on 08/28/23.

## 2023-08-28 ENCOUNTER — Other Ambulatory Visit: Payer: Self-pay

## 2023-09-12 ENCOUNTER — Other Ambulatory Visit: Payer: Self-pay

## 2023-09-17 ENCOUNTER — Other Ambulatory Visit (HOSPITAL_COMMUNITY): Payer: Self-pay

## 2023-09-19 ENCOUNTER — Other Ambulatory Visit: Payer: Self-pay

## 2023-09-19 ENCOUNTER — Other Ambulatory Visit (HOSPITAL_COMMUNITY): Payer: Self-pay

## 2023-09-19 NOTE — Progress Notes (Signed)
 Specialty Pharmacy Refill Coordination Note  [name] is a 68 y.o. female contacted today regarding refills of specialty medication(s) Evolocumab  (Repatha  SureClick)   Patient requested Delivery   Delivery date: 09/25/23   Verified address: [address] [name]  [place] [address]   Medication will be filled on 09/24/23.

## 2023-09-24 ENCOUNTER — Other Ambulatory Visit: Payer: Self-pay

## 2023-10-02 DIAGNOSIS — R519 Headache, unspecified: Secondary | ICD-10-CM | POA: Diagnosis not present

## 2023-10-02 DIAGNOSIS — K08409 Partial loss of teeth, unspecified cause, unspecified class: Secondary | ICD-10-CM | POA: Diagnosis not present

## 2023-10-04 DIAGNOSIS — E890 Postprocedural hypothyroidism: Secondary | ICD-10-CM | POA: Diagnosis not present

## 2023-10-11 DIAGNOSIS — E785 Hyperlipidemia, unspecified: Secondary | ICD-10-CM | POA: Diagnosis not present

## 2023-10-11 DIAGNOSIS — N182 Chronic kidney disease, stage 2 (mild): Secondary | ICD-10-CM | POA: Diagnosis not present

## 2023-10-11 DIAGNOSIS — I700 Atherosclerosis of aorta: Secondary | ICD-10-CM | POA: Diagnosis not present

## 2023-10-11 DIAGNOSIS — I10 Essential (primary) hypertension: Secondary | ICD-10-CM | POA: Diagnosis not present

## 2023-10-11 DIAGNOSIS — K219 Gastro-esophageal reflux disease without esophagitis: Secondary | ICD-10-CM | POA: Diagnosis not present

## 2023-10-11 DIAGNOSIS — E890 Postprocedural hypothyroidism: Secondary | ICD-10-CM | POA: Diagnosis not present

## 2023-10-12 ENCOUNTER — Other Ambulatory Visit: Payer: Self-pay | Admitting: Pharmacy Technician

## 2023-10-12 ENCOUNTER — Other Ambulatory Visit: Payer: Self-pay

## 2023-10-12 NOTE — Progress Notes (Signed)
 Specialty Pharmacy Refill Coordination Note  [name] is a 68 y.o. female contacted today regarding refills of specialty medication(s) Evolocumab  (Repatha  SureClick)   Patient requested Delivery   Delivery date: 10/18/23   Verified address: [address] [name]  [place] [address]   Medication will be filled on 10/17/23.

## 2023-10-16 ENCOUNTER — Other Ambulatory Visit: Payer: Self-pay

## 2023-10-17 ENCOUNTER — Other Ambulatory Visit: Payer: Self-pay

## 2023-11-02 ENCOUNTER — Other Ambulatory Visit (HOSPITAL_COMMUNITY): Payer: Self-pay

## 2023-11-07 DIAGNOSIS — R300 Dysuria: Secondary | ICD-10-CM | POA: Diagnosis not present

## 2023-11-13 ENCOUNTER — Other Ambulatory Visit: Payer: Self-pay

## 2023-11-15 ENCOUNTER — Other Ambulatory Visit: Payer: Self-pay

## 2023-11-15 ENCOUNTER — Other Ambulatory Visit (HOSPITAL_COMMUNITY): Payer: Self-pay

## 2023-11-15 NOTE — Progress Notes (Signed)
 Specialty Pharmacy Refill Coordination Note  Spoke with [name] is a 68 y.o. female contacted today regarding refills of specialty medication(s) Evolocumab  (Repatha  SureClick)  Doses on hand: 0  Injection date: 11/23/23   Patient requested: Delivery   Delivery date: 11/16/23   Verified address: [address] [name] [address]  Medication will be filled on 11/15/23.

## 2023-12-06 ENCOUNTER — Other Ambulatory Visit (HOSPITAL_COMMUNITY): Payer: Self-pay

## 2023-12-11 ENCOUNTER — Other Ambulatory Visit (HOSPITAL_COMMUNITY): Payer: Self-pay

## 2023-12-11 ENCOUNTER — Other Ambulatory Visit: Payer: Self-pay

## 2023-12-11 MED ORDER — REPATHA SURECLICK 140 MG/ML ~~LOC~~ SOAJ
SUBCUTANEOUS | 6 refills | Status: AC
  Filled 2023-12-14: qty 2, 28d supply, fill #0
  Filled 2024-01-07 – 2024-01-09 (×2): qty 2, 28d supply, fill #1
  Filled 2024-01-31: qty 2, 28d supply, fill #2
  Filled 2024-02-27: qty 2, 28d supply, fill #3
  Filled 2024-03-26: qty 2, 28d supply, fill #4
  Filled 2024-04-23: qty 2, 28d supply, fill #5

## 2023-12-14 ENCOUNTER — Other Ambulatory Visit (HOSPITAL_COMMUNITY): Payer: Self-pay

## 2023-12-14 ENCOUNTER — Other Ambulatory Visit: Payer: Self-pay

## 2023-12-14 NOTE — Progress Notes (Signed)
 Specialty Pharmacy Refill Coordination Note  Spoke with Cherisa Brucker is a 68 y.o. female contacted today regarding refills of specialty medication(s) Evolocumab  (Repatha  SureClick)  Doses on hand: 0  Injection date: 12/21/23   Patient requested: Delivery   Delivery date: 12/18/23   Verified address: [address] [name] [address]  Medication will be filled on 12/17/23.

## 2023-12-19 NOTE — Progress Notes (Signed)
 Cardiology Office Note:  .   Date:  12/20/2023  ID:  [name], DOB [birth year], MRN [id number] PCP: Verdia Lombard, MD  [facility] Providers Cardiologist:  Gordy Bergamo, MD {  History of Present Illness: [name] is a 68 y.o. female  with PMHx of multinodular goiter status post thyroidectomy 8/23, HTN, HLD (statin intolerance and is on Repatha ) reports to [facility] office for follow up.   Last seen in heartcare 02/02/2023 by Albino Beauvais, for follow up. Reported some improvement in fatigue with switching to short acting metoprolol . Discussed sleep study but patient deferred at that time. Overall, doing well from cardiac standpoint. Continued on Amlodipine  10 mg daily, Irbesartan  300 mg daily.   Today, reports intermittent palpitations associated with exertion or current 3 times per week, described as racing lasting less than 2 to 3 minutes.  She suspects this is secondary to Graves' disease and has discussed with endocrinology who agrees.  Discussed Zio but patient prefers to defer and will consider if symptoms worsen.  Denies chest pain, SOB, dizziness, lightheadedness, syncope. Reports compliance with medications.  Notes a mix of healthy and unhealthy food.  She goes walking and line dancing still without any concerns. Denies tobacco use/alcohol/drug use.    ROS: 10 point review of system has been reviewed and considered negative except ones been listed in the HPI.   Studies Reviewed: SABRA   EKG Interpretation Date/Time:  Thursday December 20 2023 15:18:40 EDT Ventricular Rate:  51 PR Interval:  156 QRS Duration:  88 QT Interval:  474 QTC Calculation: 436 R Axis:   58  Text Interpretation: Sinus bradycardia Nonspecific T wave abnormality When compared with ECG of 14-Nov-2022 12:52, Nonspecific T wave abnormality no longer evident in Inferior leads Nonspecific T wave abnormality, improved in Lateral leads Confirmed by Sheron Hallmark ([id number]) on 12/20/2023 3:25:38  PM   No cardiac studies to review.    Physical Exam:   VS:  BP 122/74   Pulse (!) 51   Ht 5' 6.5 (1.689 m)   Wt 184 lb 12.8 oz (83.8 kg)   SpO2 98%   BMI 29.38 kg/m    Wt Readings from Last 3 Encounters:  12/20/23 184 lb 12.8 oz (83.8 kg)  02/02/23 178 lb (80.7 kg)  11/29/22 180 lb 9.6 oz (81.9 kg)    GEN: Well nourished, well developed in no acute distress while sitting in chair.  NECK: No JVD; No carotid bruits CARDIAC: RRR, no murmurs, rubs, gallops RESPIRATORY:  Clear to auscultation without rales, wheezing or rhonchi  ABDOMEN: Soft, non-tender, non-distended EXTREMITIES:  No edema; No deformity   ASSESSMENT AND PLAN: .   Primary hypertension She does not monitor BP at home regularly but plans to start monitoring at least once a week. BP this OV well controlled today: 122/74 11/2023: K/Cr WNL.  Continue on Amlodipine  10 mg daily, Irbesartan  300 mg daily, Lopressor  25 mg daily Previously on Toprol  XL but discontinued due to fatigue. Encourage physical activity for 150 minutes per week and heart healthy low sodium diet. Discussed limiting sodium intake to < 2 grams daily.     Sinus bradycardia HR today 51 bpm  Denies any lightheadedness, dizziness or syncope. Continue Lopressor  25 mg daily.  Discussed HR 50s is common in active individuals. Also, if symptoms develop or HR in low 40s to contact office then we will decrease Lopressor  to 12.5 mg or discontinue.  Hyperlipidemia LDL goal <100 11/2022 LDL 113, LFT WNL  Previously noted, statin intolerance  Continue on Repatha  Follows with PCP   Malaise and fatigue Snoring Daytime somnolence Appears to be related to possible sleep apnea. Patient previously deferred sleep study and continues to defer at this time. Encouraged to avoid supine sleeping. Discussed sleep hygiene.  Previously on Toprol  XL but discontinued due to fatigue.  Palpitations  reports intermittent palpitations associated with exertion or current 3 times  per week, described as racing lasting less than 2 to 3 minutes.  She suspects this is secondary to Graves' disease and has discussed with endocrinology who agrees.   Discussed Zio but patient prefers to defer and will consider if symptoms worsen.     Dispo: Follow-up in 6 months with Dr. Ganji or Cleaver, PA-C  Signed, Baltasar Twilley Y Bruk Tumolo, PA-C

## 2023-12-20 ENCOUNTER — Encounter: Payer: Self-pay | Admitting: General Practice

## 2023-12-20 ENCOUNTER — Ambulatory Visit: Attending: General Practice | Admitting: Physician Assistant

## 2023-12-20 VITALS — BP 122/74 | HR 51 | Ht 66.5 in | Wt 184.8 lb

## 2023-12-20 DIAGNOSIS — E782 Mixed hyperlipidemia: Secondary | ICD-10-CM | POA: Diagnosis not present

## 2023-12-20 DIAGNOSIS — R400 Somnolence: Secondary | ICD-10-CM

## 2023-12-20 DIAGNOSIS — R0683 Snoring: Secondary | ICD-10-CM

## 2023-12-20 DIAGNOSIS — R001 Bradycardia, unspecified: Secondary | ICD-10-CM | POA: Diagnosis not present

## 2023-12-20 DIAGNOSIS — R002 Palpitations: Secondary | ICD-10-CM

## 2023-12-20 DIAGNOSIS — E785 Hyperlipidemia, unspecified: Secondary | ICD-10-CM | POA: Diagnosis not present

## 2023-12-20 DIAGNOSIS — R5381 Other malaise: Secondary | ICD-10-CM | POA: Diagnosis not present

## 2023-12-20 DIAGNOSIS — R5383 Other fatigue: Secondary | ICD-10-CM | POA: Diagnosis not present

## 2023-12-20 DIAGNOSIS — I10 Essential (primary) hypertension: Secondary | ICD-10-CM

## 2023-12-20 NOTE — Patient Instructions (Signed)
 Medication Instructions:  Your physician recommends that you continue on your current medications as directed. Please refer to the Current Medication list given to you today.  *If you need a refill on your cardiac medications before your next appointment, please call your pharmacy*  Lab Work: None ordered If you have labs (blood work) drawn today and your tests are completely normal, you will receive your results only by: MyChart Message (if you have MyChart) OR A paper copy in the mail If you have any lab test that is abnormal or we need to change your treatment, we will call you to review the results.  Follow-Up: At [facility], you and your health needs are our priority.  As part of our continuing mission to provide you with exceptional heart care, our providers are all part of one team.  This team includes your primary Cardiologist (physician) and Advanced Practice Providers or APPs (Physician Assistants and Nurse Practitioners) who all work together to provide you with the care you need, when you need it.  Your next appointment:   6 month(s)  Provider:   Dr Ladona or Josefa Beauvais, NP

## 2023-12-24 ENCOUNTER — Encounter: Payer: Self-pay | Admitting: Physician Assistant

## 2024-01-01 DIAGNOSIS — I10 Essential (primary) hypertension: Secondary | ICD-10-CM | POA: Diagnosis not present

## 2024-01-01 DIAGNOSIS — E785 Hyperlipidemia, unspecified: Secondary | ICD-10-CM | POA: Diagnosis not present

## 2024-01-01 DIAGNOSIS — I700 Atherosclerosis of aorta: Secondary | ICD-10-CM | POA: Diagnosis not present

## 2024-01-01 DIAGNOSIS — E890 Postprocedural hypothyroidism: Secondary | ICD-10-CM | POA: Diagnosis not present

## 2024-01-01 DIAGNOSIS — N182 Chronic kidney disease, stage 2 (mild): Secondary | ICD-10-CM | POA: Diagnosis not present

## 2024-01-01 DIAGNOSIS — K219 Gastro-esophageal reflux disease without esophagitis: Secondary | ICD-10-CM | POA: Diagnosis not present

## 2024-01-07 ENCOUNTER — Other Ambulatory Visit (HOSPITAL_COMMUNITY): Payer: Self-pay

## 2024-01-08 DIAGNOSIS — M62830 Muscle spasm of back: Secondary | ICD-10-CM | POA: Diagnosis not present

## 2024-01-08 DIAGNOSIS — I700 Atherosclerosis of aorta: Secondary | ICD-10-CM | POA: Diagnosis not present

## 2024-01-08 DIAGNOSIS — Z23 Encounter for immunization: Secondary | ICD-10-CM | POA: Diagnosis not present

## 2024-01-08 DIAGNOSIS — I10 Essential (primary) hypertension: Secondary | ICD-10-CM | POA: Diagnosis not present

## 2024-01-08 DIAGNOSIS — E890 Postprocedural hypothyroidism: Secondary | ICD-10-CM | POA: Diagnosis not present

## 2024-01-08 DIAGNOSIS — E7841 Elevated Lipoprotein(a): Secondary | ICD-10-CM | POA: Diagnosis not present

## 2024-01-08 DIAGNOSIS — Z0000 Encounter for general adult medical examination without abnormal findings: Secondary | ICD-10-CM | POA: Diagnosis not present

## 2024-01-08 DIAGNOSIS — N182 Chronic kidney disease, stage 2 (mild): Secondary | ICD-10-CM | POA: Diagnosis not present

## 2024-01-08 DIAGNOSIS — E785 Hyperlipidemia, unspecified: Secondary | ICD-10-CM | POA: Diagnosis not present

## 2024-01-09 ENCOUNTER — Other Ambulatory Visit: Payer: Self-pay

## 2024-01-09 ENCOUNTER — Other Ambulatory Visit (HOSPITAL_COMMUNITY): Payer: Self-pay

## 2024-01-09 NOTE — Progress Notes (Signed)
 Specialty Pharmacy Refill Coordination Note  [name] is a 68 y.o. female contacted today regarding refills of specialty medication(s) Evolocumab  (Repatha  SureClick)   Patient requested Delivery   Delivery date: 01/11/24   Verified address: [address] [name] [address]   Medication will be filled on 01/10/24.

## 2024-01-15 DIAGNOSIS — M25551 Pain in right hip: Secondary | ICD-10-CM | POA: Diagnosis not present

## 2024-01-22 DIAGNOSIS — Z23 Encounter for immunization: Secondary | ICD-10-CM | POA: Diagnosis not present

## 2024-01-31 ENCOUNTER — Other Ambulatory Visit: Payer: Self-pay

## 2024-02-04 ENCOUNTER — Other Ambulatory Visit (HOSPITAL_COMMUNITY): Payer: Self-pay

## 2024-02-04 ENCOUNTER — Other Ambulatory Visit: Payer: Self-pay

## 2024-02-04 NOTE — Progress Notes (Signed)
 Specialty Pharmacy Refill Coordination Note  [name] is a 68 y.o. female contacted today regarding refills of specialty medication(s) Evolocumab  (Repatha  SureClick)   Patient requested Delivery   Delivery date: 02/08/24   Verified address: [address] [name] [address]   Medication will be filled on: 02/07/24

## 2024-02-06 ENCOUNTER — Other Ambulatory Visit: Payer: Self-pay

## 2024-02-12 ENCOUNTER — Ambulatory Visit: Admitting: Podiatry

## 2024-02-25 DIAGNOSIS — H2511 Age-related nuclear cataract, right eye: Secondary | ICD-10-CM | POA: Diagnosis not present

## 2024-02-25 DIAGNOSIS — H04123 Dry eye syndrome of bilateral lacrimal glands: Secondary | ICD-10-CM | POA: Diagnosis not present

## 2024-02-27 ENCOUNTER — Other Ambulatory Visit: Payer: Self-pay

## 2024-02-29 ENCOUNTER — Other Ambulatory Visit: Payer: Self-pay

## 2024-02-29 ENCOUNTER — Other Ambulatory Visit: Payer: Self-pay | Admitting: Pharmacy Technician

## 2024-02-29 NOTE — Progress Notes (Signed)
 Specialty Pharmacy Refill Coordination Note  [name] is a 68 y.o. female contacted today regarding refills of specialty medication(s) Evolocumab  (Repatha  SureClick)   Patient requested Delivery   Delivery date: 03/06/24   Verified address: [address]  [place]   Medication will be filled on: 03/05/24

## 2024-03-03 ENCOUNTER — Other Ambulatory Visit: Payer: Self-pay

## 2024-03-03 NOTE — Progress Notes (Signed)
 Clinical Intervention Note  Clinical Intervention Notes: Patient reported restarting Zetia. No DDIs identified with Repatha    Clinical Intervention Outcomes: Prevention of an adverse drug event   Advertising Account Planner

## 2024-03-05 ENCOUNTER — Other Ambulatory Visit: Payer: Self-pay

## 2024-03-26 ENCOUNTER — Other Ambulatory Visit: Payer: Self-pay

## 2024-03-26 ENCOUNTER — Other Ambulatory Visit (HOSPITAL_COMMUNITY): Payer: Self-pay

## 2024-03-26 NOTE — Progress Notes (Signed)
 Specialty Pharmacy Refill Coordination Note  [name] is a 69 y.o. female contacted today regarding refills of specialty medication(s) Evolocumab  (Repatha  SureClick)   Patient requested Delivery   Delivery date: 04/03/24   Verified address: [address]  [place]   Medication will be filled on: 04/02/24   Patient is aware of the $12.65 copay

## 2024-04-02 ENCOUNTER — Other Ambulatory Visit: Payer: Self-pay

## 2024-04-23 ENCOUNTER — Other Ambulatory Visit: Payer: Self-pay

## 2024-04-25 ENCOUNTER — Other Ambulatory Visit: Payer: Self-pay

## 2024-04-25 NOTE — Progress Notes (Signed)
 Specialty Pharmacy Refill Coordination Note  [name] is a 69 y.o. female contacted today regarding refills of specialty medication(s) Evolocumab  (Repatha  SureClick)   Patient requested Delivery   Delivery date: 05/02/24   Verified address: [address]  [place]   Medication will be filled on: 05/01/24
